# Patient Record
Sex: Female | Born: 2000 | Race: White | Hispanic: No | Marital: Married | Smoking: Never smoker
Health system: Southern US, Community
[De-identification: ages and names within clinical notes are randomized; demographics above are authoritative.]

## PROBLEM LIST (undated history)

## (undated) DIAGNOSIS — T7840XA Allergy, unspecified, initial encounter: Secondary | ICD-10-CM

## (undated) DIAGNOSIS — F32A Depression, unspecified: Secondary | ICD-10-CM

## (undated) DIAGNOSIS — F419 Anxiety disorder, unspecified: Secondary | ICD-10-CM

## (undated) HISTORY — PX: TONSILECTOMY, ADENOIDECTOMY, BILATERAL MYRINGOTOMY AND TUBES: SHX2538

## (undated) HISTORY — PX: WISDOM TOOTH EXTRACTION: SHX21

## (undated) HISTORY — PX: TYMPANOSTOMY TUBE PLACEMENT: SHX32

## (undated) HISTORY — DX: Allergy, unspecified, initial encounter: T78.40XA

## (undated) HISTORY — DX: Anxiety disorder, unspecified: F41.9

## (undated) HISTORY — PX: TONSILLECTOMY: SUR1361

## (undated) HISTORY — DX: Depression, unspecified: F32.A

---
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
## 2021-02-06 ENCOUNTER — Ambulatory Visit: Payer: Medicaid Other | Admitting: Nurse Practitioner

## 2021-02-27 ENCOUNTER — Other Ambulatory Visit: Payer: Self-pay

## 2021-02-27 ENCOUNTER — Ambulatory Visit: Payer: Managed Care, Other (non HMO) | Admitting: Nurse Practitioner

## 2021-02-27 ENCOUNTER — Encounter: Payer: Self-pay | Admitting: Nurse Practitioner

## 2021-02-27 VITALS — BP 110/72 | HR 100 | Temp 97.9°F | Resp 18 | Ht 63.0 in | Wt 156.7 lb

## 2021-02-27 DIAGNOSIS — J0141 Acute recurrent pansinusitis: Secondary | ICD-10-CM | POA: Diagnosis not present

## 2021-02-27 DIAGNOSIS — F3181 Bipolar II disorder: Secondary | ICD-10-CM

## 2021-02-27 DIAGNOSIS — J029 Acute pharyngitis, unspecified: Secondary | ICD-10-CM

## 2021-02-27 DIAGNOSIS — Z7689 Persons encountering health services in other specified circumstances: Secondary | ICD-10-CM

## 2021-02-27 DIAGNOSIS — F99 Mental disorder, not otherwise specified: Secondary | ICD-10-CM

## 2021-02-27 DIAGNOSIS — T7840XA Allergy, unspecified, initial encounter: Secondary | ICD-10-CM

## 2021-02-27 DIAGNOSIS — F5105 Insomnia due to other mental disorder: Secondary | ICD-10-CM

## 2021-02-27 MED ORDER — LEVOFLOXACIN 750 MG PO TABS: 750.0000 mg | ORAL_TABLET | Freq: Every day | ORAL | 0 refills | Status: DC

## 2021-02-27 MED ORDER — LEVOFLOXACIN 750 MG PO TABS: 750.0000 mg | ORAL_TABLET | Freq: Every day | ORAL | 0 refills | Status: AC

## 2021-02-27 NOTE — Progress Notes (Signed)
BP 110/72    Pulse 100    Temp 97.9 F (36.6 C) (Oral)    Resp 18    Ht 5\' 3"  (1.6 m)    Wt 156 lb 11.2 oz (71.1 kg)    SpO2 95%    BMI 27.76 kg/m    Subjective:    Patient ID: , female    DOB: 05-31-2000, 21 y.o.   MRN: [id number]  HPI: Catherine Stewart is a 21 y.o. female, here with husband  Chief Complaint  Patient presents with   Establish Care   URI    Cough, sore throat, weak fatigue, congested   Establish care: She says her last physical 6/22.  She has requested her records from her previous provider.    URI: She says that her symptoms started 02/18/21, with nasal congestion, sore throat and fatigue. She says she went to urgent care and was tested for flu and covid. She says they were negative.  She says her sinus congestion has gotten worse and so has her sore throat.  She denies any fever, cough or shortness of breath.  Her frontal and maxillary sinus is tender to the touch. She says her throat has gotten more sore.  She says it hurts to swallow but is able to.  Will get throat culture.  Discussed OTC treatments for symptoms. Discussed that since her symptoms have lasted for almost two weeks will give antibiotics.   Allergies/ sinusitis:  She says she takes Flonase, zyrtec, Singulair and mucinex.  She says she does not take her allergy medication every day. She says that she has flares through out the year. She says that she would like to have allergy testing done.  Will refer to allergist.   Bipolar type 2/ insomnia:  She says she was on medication when she was [age]. She says she was on abilify and trazodone and was seen at [facility] but had trouble getting back in.  She says she is not currently taking any medication.  She says that she is in a much better place and feels she was diagnosed improperly.  Depression screen [facility] 2/9 02/27/2021  Decreased Interest 0  Down, Depressed, Hopeless 0  PHQ - 2 Score 0  Altered sleeping 0  Tired, decreased energy 0   Change in appetite 0  Feeling bad or failure about yourself  0  Trouble concentrating 0  Moving slowly or fidgety/restless 0  Suicidal thoughts 0  PHQ-9 Score 0  Difficult doing work/chores Not difficult at all    GAD 7 : Generalized Anxiety Score 02/27/2021  Nervous, Anxious, on Edge 0  Control/stop worrying 0  Worry too much - different things 0  Trouble relaxing 0  Restless 0  Easily annoyed or irritable 0  Afraid - awful might happen 0  Total GAD 7 Score 0  Anxiety Difficulty Not difficult at all     Allergies and medications reviewed and updated.  Relevant past medical, surgical, family and social history reviewed and updated as indicated. Interim medical history since our last visit reviewed.  Review of Systems  Constitutional: Negative for fever or weight change.  HEENT: positive for nasal congestion, facial pressure and tenderness and sore throat Respiratory: Negative for cough and shortness of breath.   Cardiovascular: Negative for chest pain or palpitations.  Gastrointestinal: Negative for abdominal pain, no bowel changes.  Musculoskeletal: Negative for gait problem or joint swelling.  Skin: Negative for rash.  Neurological: Negative for dizziness or headache.  No other specific complaints in a complete review of systems (except as listed in HPI above).      Objective:    BP 110/72    Pulse 100    Temp 97.9 F (36.6 C) (Oral)    Resp 18    Ht 5\' 3"  (1.6 m)    Wt 156 lb 11.2 oz (71.1 kg)    SpO2 95%    BMI 27.76 kg/m   Wt Readings from Last 3 Encounters:  02/27/21 156 lb 11.2 oz (71.1 kg) (85 %, Z= 1.06)*  09/23/20 176 lb (79.8 kg) (94 %, Z= 1.54)*  05/09/20 163 lb (73.9 kg) (90 %, Z= 1.28)*   * Growth percentiles are based on CDC (Girls, 2-20 Years) data.    Physical Exam  Constitutional: Patient appears well-developed and well-nourished.  No distress.  HEENT: head atraumatic, normocephalic, pupils equal and reactive to light, ears TMs clear, neck supple,  throat red and inflamed, no  Cardiovascular: Normal rate, regular rhythm and normal heart sounds.  No murmur heard. No BLE edema. Pulmonary/Chest: Effort normal and breath sounds normal. No respiratory distress. Abdominal: Soft.  There is no tenderness. Psychiatric: Patient has a normal mood and affect. behavior is normal. Judgment and thought content normal.   Results for orders placed or performed in visit on 09/23/20  Urine Culture   Specimen: Urine   UR  Result Value Ref Range   Urine Culture, Routine CANCELED   POCT urinalysis dipstick  Result Value Ref Range   Color, UA     Clarity, UA     Glucose, UA Negative Negative   Bilirubin, UA Negative    Ketones, UA Negative    Spec Grav, UA 1.010 1.010 - 1.025   Blood, UA trace    pH, UA 5.0 5.0 - 8.0   Protein, UA Negative Negative   Urobilinogen, UA 1.0 0.2 or 1.0 E.U./dL   Nitrite, UA Negative    Leukocytes, UA Trace (A) Negative   Appearance     Odor    Cervicovaginal ancillary only  Result Value Ref Range   Bacterial Vaginitis (gardnerella) Negative    Candida Vaginitis Positive (A)    Candida Glabrata Negative    Comment      Normal Reference Range Bacterial Vaginosis - Negative   Comment Normal Reference Range Candida Species - Negative    Comment Normal Reference Range Candida Galbrata - Negative       Assessment & Plan:   1. Acute recurrent pansinusitis -OTC treatments for symptoms -push fluids -saline nasal washes - levofloxacin (LEVAQUIN) 750 MG tablet; Take 1 tablet (750 mg total) by mouth daily for 5 days.  Dispense: 5 tablet; Refill: 0  2. Sore throat -gargle with salt water, can use throat lozenges - Culture, Group A Strep  3. Allergy, initial encounter -continue taking allergy medications - Ambulatory referral to Allergy  4. Bipolar 2 disorder (HCC) -continue to monitor  5. Insomnia due to other mental disorder -continue to monitor  6. Encounter to establish care -get records from previous  provider  Follow up plan: Return in about 6 months (around 08/27/2021) for cpe.

## 2021-02-27 NOTE — Addendum Note (Signed)
Addended by: Della Goo F on: 02/27/2021 04:06 PM   Modules accepted: Orders

## 2021-03-01 ENCOUNTER — Other Ambulatory Visit: Payer: Self-pay | Admitting: Nurse Practitioner

## 2021-03-01 ENCOUNTER — Ambulatory Visit: Payer: Self-pay | Admitting: *Deleted

## 2021-03-01 DIAGNOSIS — J020 Streptococcal pharyngitis: Secondary | ICD-10-CM

## 2021-03-01 LAB — CULTURE, GROUP A STREP
MICRO NUMBER:: 12845229
SPECIMEN QUALITY:: ADEQUATE

## 2021-03-01 MED ORDER — CLINDAMYCIN HCL 300 MG PO CAPS: 300.0000 mg | ORAL_CAPSULE | Freq: Three times a day (TID) | ORAL | 0 refills | Status: AC

## 2021-03-01 NOTE — Telephone Encounter (Signed)
°  Summary: strep results/work note   Pt called and stated that she has a note to stay out of work because of possible strep. Pt states results have not come back yet and would like to know if she can return to work. Please advise      Chief Complaint: requesting strep test results, continued sx , right ear pain, drainage. Requesting if she can be written out of work longer due to sx  Symptoms: not feeling good, sore throat, right ear drainage, merky yellow. Pain , lightheaded, short of breath with exertion. Frequency: since 02/27/21 Pertinent Negatives: Patient denies difficulty breathing , chest pain, no fever Disposition: [] ED /[] Urgent Care (no appt availability in office) / [] Appointment(In office/virtual)/ []  [place] [facility]/ [] Home Care/ [] Refused Recommended Disposition /[] [place] [facility]/ [x]  Follow-up with PCP Additional Notes:  Please advise if another antibiotic will help clear up symptoms.  Please advise if another appt need for patient and her husband, seen by E. Rosana Berger. Catherine Stewart DOB: 11/16/1998.strep test result report : active. No results at this time. Please advise.       Reason for Disposition  Yellow or green discharge  Answer Assessment - Initial Assessment Questions 1. TEST: "Was it a rapid strep test or a throat culture for strep?     na 2. RESULT: "Was it positive or negative for strep?"     Results show still active  3. FEVER: "Do you have a fever?" If Yes, ask: "What is your temperature, how was it measured, and when did it start?"     no 4. PAIN: "How bad is the sore throat?"     Continues , no appetite  5. PREGNANCY: "Is there any chance you are pregnant?" "When was your last menstrual period?"     na  Answer Assessment - Initial Assessment Questions 1. LOCATION: "Which ear is involved?"      Right ear  2. COLOR: "What is the color of the discharge?"      Merky , yellow  3. CONSISTENCY: "How runny is the discharge? Could it be water?"       runny 4. ONSET: "When did you first notice the discharge?"     na 5. PAIN: "Is there any earache?" "How bad is it?"  (Scale 1-10; or mild, moderate, severe)     Yes  6. OBJECTS: "Have you put anything in your ear?" (e.g., Q-tip, other object)      na 7. OTHER SYMPTOMS: "Do you have any other symptoms?" (e.g., headache, fever, dizziness, vomiting, runny nose)     Sore throat treated with levaquin  8. PREGNANCY: "Is there any chance you are pregnant?" "When was your last menstrual period?"     na  Protocols used: Strep Throat Test Follow-up Call-[facility], [place]

## 2021-04-03 ENCOUNTER — Ambulatory Visit: Payer: Self-pay

## 2021-04-03 NOTE — Telephone Encounter (Signed)
° °  Chief Complaint: Laceration left thumb Symptoms: Red, draining yellow Frequency: Cut 2 weeks ago Pertinent Negatives: Patient denies fever Disposition: [] ED /[] Urgent Care (no appt availability in office) / [x] Appointment(In office/virtual)/ []  [place] [facility]/ [] Home Care/ [] Refused Recommended Disposition /[] [place] [facility]/ []  Follow-up with PCP Additional Notes:    Reason for Disposition  [1] Looks infected (spreading redness, pus) AND [2] no fever  Answer Assessment - Initial Assessment Questions 1. APPEARANCE of INJURY: "What does the injury look like?"      Yellow and drainig 2. SIZE: "How large is the cut?"      Dime size 3. BLEEDING: "Is it bleeding now?" If Yes, ask: "Is it difficult to stop?"      No 4. LOCATION: "Where is the injury located?"      Left thumb 5. ONSET: "How long ago did the injury occur?"      2 weeks ago 6. MECHANISM: "Tell me how it happened."      Cut 2 weeks ago 7. TETANUS: "When was the last tetanus booster?"     Unsure 8. PREGNANCY: "Is there any chance you are pregnant?" "When was your last menstrual period?"     No  Protocols used: Cuts and Lacerations-[facility]

## 2021-04-03 NOTE — Progress Notes (Signed)
Name: Catherine Stewart   MRN: [id number]    DOB: 2000-03-23   Date:04/04/2021       Progress Note  Subjective  Chief Complaint  Laceration w/Drainage   HPI  Laceration: patient states she was cutting up some vegetables a few weeks ago and cut her left thumb on the palmar side near the base. She went urgent care but sutures or steri strips were not placed she was advised to keep it clean and covered , she came in today because the area is still open and last night it had some yellow drainage. She states it looks better now. Still sore to tough and rom, no redness around the laceration site. She is due for Tdap   Past Surgical History:  Procedure Laterality Date   INDUCED ABORTION  2019   TONSILECTOMY, ADENOIDECTOMY, BILATERAL MYRINGOTOMY AND TUBES     TONSILLECTOMY     TYMPANOSTOMY TUBE PLACEMENT      Family History  Problem Relation Age of Onset   Healthy Mother    Cirrhosis Father     Social History   Tobacco Use   Smoking status: Never   Smokeless tobacco: Never  Substance Use Topics   Alcohol use: Yes     Current Outpatient Medications:    cetirizine (ZYRTEC) 10 MG tablet, Take 10 mg by mouth at bedtime., Disp: , Rfl:    fluticasone (FLONASE) 50 MCG/ACT nasal spray, Place 1 spray into both nostrils daily., Disp: , Rfl:    Levonorgestrel (KYLEENA) 19.5 MG IUD, by Intrauterine route., Disp: , Rfl:    montelukast (SINGULAIR) 10 MG tablet, Take 10 mg by mouth at bedtime., Disp: , Rfl:   Allergies  Allergen Reactions   Doxycycline Nausea And Vomiting   Penicillins Hives   Amoxicillin-Pot Clavulanate Rash    I personally reviewed active problem list, medication list, allergies, family history, social history, health maintenance with the patient/caregiver today.   ROS  Ten systems reviewed and is negative except as mentioned in HPI   Objective  Vitals:   04/04/21 0751  BP: 126/70  Pulse: 91  Resp: 16  SpO2: 99%  Weight: 153 lb (69.4 kg)  Height: 5\' 3"  (1.6 m)     Body mass index is 27.1 kg/m.  Physical Exam  Constitutional: Patient appears well-developed and well-nourished.  No distress.  HEENT: head atraumatic, normocephalic, pupils equal and reactive to light, neck supple Cardiovascular: Normal rate, regular rhythm and normal heart sounds.  No murmur heard. No BLE edema. Pulmonary/Chest: Effort normal and breath sounds normal. No respiratory distress. Abdominal: Soft.  There is no tenderness. Thumb: see attached picture Psychiatric: Patient has a normal mood and affect. behavior is normal. Judgment and thought content normal.   Recent Results (from the past 2160 hour(s))  Culture, Group A Strep     Status: Abnormal   Collection Time: 02/27/21  3:42 PM   Specimen: Throat  Result Value Ref Range   MICRO NUMBER: 04/27/21    SPECIMEN QUALITY: Adequate    SOURCE: THROAT    STATUS: FINAL    ISOLATE 1: Streptococcus pyogenes (A)     Comment: Group A Streptococcus isolated Beta-hemolytic streptococci are predictably susceptible to Penicillin and other beta-lactams. Susceptibility testing not routinely performed. Please contact the laboratory within 3 days if susceptibility testing is desired.     PHQ2/9: Depression screen [facility] 2/9 04/04/2021 02/27/2021  Decreased Interest 0 0  Down, Depressed, Hopeless 0 0  PHQ - 2 Score 0 0  Altered sleeping  0 0  Tired, decreased energy 0 0  Change in appetite 0 0  Feeling bad or failure about yourself  0 0  Trouble concentrating 0 0  Moving slowly or fidgety/restless 0 0  Suicidal thoughts 0 0  PHQ-9 Score 0 0  Difficult doing work/chores - Not difficult at all    phq 9 is negative   Fall Risk: Fall Risk  04/04/2021 02/27/2021  Falls in the past year? 0 0  Number falls in past yr: 0 0  Injury with Fall? 0 0  Risk for fall due to : No Fall Risks -  Follow up Falls prevention discussed Falls evaluation completed      Functional Status Survey: Is the patient deaf or have difficulty hearing?:  No Does the patient have difficulty seeing, even when wearing glasses/contacts?: Yes Does the patient have difficulty concentrating, remembering, or making decisions?: No Does the patient have difficulty walking or climbing stairs?: No Does the patient have difficulty dressing or bathing?: No Does the patient have difficulty doing errands alone such as visiting a doctor's office or shopping?: No    Assessment & Plan  1. Laceration of left thumb without damage to nail, foreign body presence unspecified, subsequent encounter  - Tdap vaccine greater than or equal to 7yo IM Advised to do rom exercise, try to keep area open when resting in pm  2. Need for Tdap vaccination  - Tdap vaccine greater than or equal to 7yo IM

## 2021-04-04 ENCOUNTER — Encounter: Payer: Self-pay | Admitting: Family Medicine

## 2021-04-04 ENCOUNTER — Other Ambulatory Visit: Payer: Self-pay

## 2021-04-04 ENCOUNTER — Ambulatory Visit (INDEPENDENT_AMBULATORY_CARE_PROVIDER_SITE_OTHER): Payer: Managed Care, Other (non HMO) | Admitting: Family Medicine

## 2021-04-04 VITALS — BP 126/70 | HR 91 | Resp 16 | Ht 63.0 in | Wt 153.0 lb

## 2021-04-04 DIAGNOSIS — Z23 Encounter for immunization: Secondary | ICD-10-CM

## 2021-04-04 DIAGNOSIS — S61012D Laceration without foreign body of left thumb without damage to nail, subsequent encounter: Secondary | ICD-10-CM

## 2021-04-04 DIAGNOSIS — J302 Other seasonal allergic rhinitis: Secondary | ICD-10-CM | POA: Insufficient documentation

## 2021-04-19 ENCOUNTER — Other Ambulatory Visit: Payer: Self-pay

## 2021-04-19 ENCOUNTER — Encounter: Payer: Self-pay | Admitting: Obstetrics and Gynecology

## 2021-04-19 ENCOUNTER — Ambulatory Visit (INDEPENDENT_AMBULATORY_CARE_PROVIDER_SITE_OTHER): Payer: Managed Care, Other (non HMO) | Admitting: Obstetrics and Gynecology

## 2021-04-19 VITALS — BP 118/60 | Ht 63.0 in | Wt 149.0 lb

## 2021-04-19 DIAGNOSIS — Z3169 Encounter for other general counseling and advice on procreation: Secondary | ICD-10-CM

## 2021-04-19 DIAGNOSIS — Z30432 Encounter for removal of intrauterine contraceptive device: Secondary | ICD-10-CM

## 2021-04-19 NOTE — Progress Notes (Signed)
? ?  Chief Complaint  ?Patient presents with  ? IUD Removal   ? ? ? ?History of Present Illness:  Catherine Stewart is a 21 y.o. that had a  [place]  IUD placed approximately 2 years ago. Since that time, she denies dyspareunia, pelvic pain, non-menstrual bleeding, vaginal d/c, heavy bleeding. Has occas spotting. Would like to conceive. Not taking PNVs. Menses were irregular prior to IUD age 22.  ?Neg STD testing 3/22; married now, no longer indicated. ? ?BP 118/60   Ht 5\' 3"  (1.6 m)   Wt 149 lb (67.6 kg)   BMI 26.39 kg/m?  ? ?Pelvic exam:  ?Two IUD strings present seen coming from the cervical os. ?EGBUS, vaginal vault and cervix: within normal limits ? ?IUD Removal ?Strings of IUD identified and grasped.  IUD removed without problem with ring forceps.  Pt tolerated this well.  IUD noted to be intact. ? ?Assessment:  ?Encounter for IUD removal ? ?Pre-conception counseling--start PNVs. ? ?F/u prn NOB  ? ?Deziah Renwick B. Nico Syme, PA-C ?04/19/2021 ?3:35 PM ?  ?

## 2021-04-27 ENCOUNTER — Ambulatory Visit: Payer: Medicaid Other | Admitting: Obstetrics & Gynecology

## 2021-08-28 ENCOUNTER — Encounter: Payer: Managed Care, Other (non HMO) | Admitting: Nurse Practitioner

## 2021-08-28 NOTE — Progress Notes (Deleted)
Name: Catherine Stewart   MRN: [id number]    DOB: [birth year]   Date:08/28/2021       Progress Note  Subjective  Chief Complaint  No chief complaint on file.   HPI  Patient presents for annual CPE.  Diet: *** Exercise: ***   Flowsheet Row Office Visit from 02/27/2021 in [facility]  AUDIT-C Score 0      Depression: Phq 9 is  {Desc; negative/positive:13464}    04/04/2021    7:51 AM 02/27/2021    3:16 PM  Depression screen PHQ 2/9  Decreased Interest 0 0  Down, Depressed, Hopeless 0 0  PHQ - 2 Score 0 0  Altered sleeping 0 0  Tired, decreased energy 0 0  Change in appetite 0 0  Feeling bad or failure about yourself  0 0  Trouble concentrating 0 0  Moving slowly or fidgety/restless 0 0  Suicidal thoughts 0 0  PHQ-9 Score 0 0  Difficult doing work/chores  Not difficult at all   Hypertension: BP Readings from Last 3 Encounters:  04/19/21 118/60  04/04/21 126/70  02/27/21 110/72   Obesity: Wt Readings from Last 3 Encounters:  04/19/21 149 lb (67.6 kg) (79 %, Z= 0.82)*  04/04/21 153 lb (69.4 kg) (83 %, Z= 0.94)*  02/27/21 156 lb 11.2 oz (71.1 kg) (85 %, Z= 1.06)*   * Growth percentiles are based on CDC (Girls, 2-20 Years) data.   BMI Readings from Last 3 Encounters:  04/19/21 26.39 kg/m (85 %, Z= 1.04)*  04/04/21 27.10 kg/m (87 %, Z= 1.15)*  02/27/21 27.76 kg/m (89 %, Z= 1.24)*   * Growth percentiles are based on CDC (Girls, 2-20 Years) data.     Vaccines:  HPV: up to at age 36 , ask insurance if age between 72-45  Shingrix: 52-64 yo and ask insurance if covered when patient above 68 yo Pneumonia:  educated and discussed with patient. Flu:  educated and discussed with patient.  Hep C Screening: ordered STD testing and prevention (HIV/chl/gon/syphilis): ordered Intimate partner violence:*** Sexual History : Menstrual History/LMP/Abnormal Bleeding:  Incontinence Symptoms:   Breast cancer:  - Last Mammogram: no concerns , does not  qualify - BRCA gene screening: none  Osteoporosis: Discussed high calcium and vitamin D supplementation, weight bearing exercises  Cervical cancer screening: no concerns, does not qualify  Skin cancer: Discussed monitoring for atypical lesions  Colorectal cancer: no concerns, does not qualify Lung cancer:   Low Dose CT Chest recommended if Age 60-80 years, 20 pack-year currently smoking OR have quit w/in 15years. Patient does not qualify.   ECG: none  Advanced Care Planning: A voluntary discussion about advance care planning including the explanation and discussion of advance directives.  Discussed health care proxy and Living will, and the patient was able to identify a health care proxy as ***.  Patient {DOES_DOES HYQ:65784} have a living will at present time. If patient does have living will, I have requested they bring this to the clinic to be scanned in to their chart.  Lipids: No results found for: "CHOL" No results found for: "HDL" No results found for: "LDLCALC" No results found for: "TRIG" No results found for: "CHOLHDL" No results found for: "LDLDIRECT"  Glucose: Glucose, Bld  Date Value Ref Range Status  10/09/2019 112 (H) 70 - 99 mg/dL Final    Comment:    Glucose reference range applies only to samples taken after fasting for at least 8 hours.  12/30/2018 95 70 - 99  mg/dL Final    Patient Active Problem List   Diagnosis Date Noted   Seasonal allergies 04/04/2021    Past Surgical History:  Procedure Laterality Date   INDUCED ABORTION  2019   TONSILECTOMY, ADENOIDECTOMY, BILATERAL MYRINGOTOMY AND TUBES     TONSILLECTOMY     TYMPANOSTOMY TUBE PLACEMENT      Family History  Problem Relation Age of Onset   Healthy Mother    Cirrhosis Father     Social History   Socioeconomic History   Marital status: Married    Spouse name: [name]   Number of children: Not on file   Years of education: Not on file   Highest education level: Not on file  Occupational  History   Not on file  Tobacco Use   Smoking status: Never   Smokeless tobacco: Never  Vaping Use   Vaping Use: Some days  Substance and Sexual Activity   Alcohol use: Yes   Drug use: Never   Sexual activity: Yes    Birth control/protection: I.U.D.  Other Topics Concern   Not on file  Social History Narrative   Not on file   Social Determinants of Health   Financial Resource Strain: Not on file  Food Insecurity: Not on file  Transportation Needs: Not on file  Physical Activity: Not on file  Stress: Not on file  Social Connections: Not on file  Intimate Partner Violence: Not on file     Current Outpatient Medications:    cetirizine (ZYRTEC) 10 MG tablet, Take 10 mg by mouth at bedtime., Disp: , Rfl:    fluticasone (FLONASE) 50 MCG/ACT nasal spray, Place 1 spray into both nostrils daily., Disp: , Rfl:    Levonorgestrel (KYLEENA) 19.5 MG IUD, by Intrauterine route. (Patient not taking: Reported on 04/19/2021), Disp: , Rfl:    montelukast (SINGULAIR) 10 MG tablet, Take 10 mg by mouth at bedtime., Disp: , Rfl:   Allergies  Allergen Reactions   Doxycycline Nausea And Vomiting   Penicillins Hives   Amoxicillin-Pot Clavulanate Rash     ROS  Constitutional: Negative for fever or weight change.  Respiratory: Negative for cough and shortness of breath.   Cardiovascular: Negative for chest pain or palpitations.  Gastrointestinal: Negative for abdominal pain, no bowel changes.  Musculoskeletal: Negative for gait problem or joint swelling.  Skin: Negative for rash.  Neurological: Negative for dizziness or headache.  No other specific complaints in a complete review of systems (except as listed in HPI above).   Objective  There were no vitals filed for this visit.  There is no height or weight on file to calculate BMI.  Physical Exam Constitutional: Patient appears well-developed and well-nourished. No distress.  HENT: Head: Normocephalic and atraumatic. Ears: B TMs ok,  no erythema or effusion; Nose: Nose normal. Mouth/Throat: Oropharynx is clear and moist. No oropharyngeal exudate.  Eyes: Conjunctivae and EOM are normal. Pupils are equal, round, and reactive to light. No scleral icterus.  Neck: Normal range of motion. Neck supple. No JVD present. No thyromegaly present.  Cardiovascular: Normal rate, regular rhythm and normal heart sounds.  No murmur heard. No BLE edema. Pulmonary/Chest: Effort normal and breath sounds normal. No respiratory distress. Abdominal: Soft. Bowel sounds are normal, no distension. There is no tenderness. no masses Breast: no lumps or masses, no nipple discharge or rashes Musculoskeletal: Normal range of motion, no joint effusions. No gross deformities Neurological: he is alert and oriented to person, place, and time. No cranial nerve deficit. Coordination,  balance, strength, speech and gait are normal.  Skin: Skin is warm and dry. No rash noted. No erythema.  Psychiatric: Patient has a normal mood and affect. behavior is normal. Judgment and thought content normal.     Fall Risk:    04/04/2021    7:51 AM 02/27/2021    3:15 PM  Fall Risk   Falls in the past year? 0 0  Number falls in past yr: 0 0  Injury with Fall? 0 0  Risk for fall due to : No Fall Risks   Follow up Falls prevention discussed Falls evaluation completed   ***  Functional Status Survey:   ***  Assessment & Plan  There are no diagnoses linked to this encounter.  -USPSTF grade A and B recommendations reviewed with patient; age-appropriate recommendations, preventive care, screening tests, etc discussed and encouraged; healthy living encouraged; see AVS for patient education given to patient -Discussed importance of 150 minutes of physical activity weekly, eat two servings of fish weekly, eat one serving of tree nuts ( cashews, pistachios, pecans, almonds.Marland Kitchen) every other day, eat 6 servings of fruit/vegetables daily and drink plenty of water and avoid sweet  beverages.

## 2021-09-01 ENCOUNTER — Ambulatory Visit (INDEPENDENT_AMBULATORY_CARE_PROVIDER_SITE_OTHER): Payer: Managed Care, Other (non HMO)

## 2021-09-01 VITALS — BP 110/60 | Wt 139.8 lb

## 2021-09-01 DIAGNOSIS — N912 Amenorrhea, unspecified: Secondary | ICD-10-CM | POA: Diagnosis not present

## 2021-09-01 LAB — POCT URINE PREGNANCY: Preg Test, Ur: POSITIVE — AB

## 2021-09-01 NOTE — Progress Notes (Signed)
Subjective:    Catherine Stewart is a 21 y.o. female who presents for evaluation of amenorrhea. She believes she could be pregnant. Pregnancy is desired.  Last period was normal.   No LMP recorded. (Menstrual status: IUD). Patient reports that IUD was removed March 2023 The following portions of the patient's history were reviewed and updated as appropriate: allergies, current medications, past family history, past medical history, past social history, past surgical history, and problem list.   Lab Review Urine HCG: positive    Assessment:    Absence of menstruation.     Plan:    Pregnancy Test:  Positive: EDD: 03/26/22. Briefly discussed positive results and sent to check out for scheduling for New OB appointments.

## 2021-09-01 NOTE — Patient Instructions (Signed)

## 2021-09-04 ENCOUNTER — Ambulatory Visit (INDEPENDENT_AMBULATORY_CARE_PROVIDER_SITE_OTHER): Payer: Managed Care, Other (non HMO)

## 2021-09-04 VITALS — Wt 138.0 lb

## 2021-09-04 DIAGNOSIS — Z3481 Encounter for supervision of other normal pregnancy, first trimester: Secondary | ICD-10-CM

## 2021-09-04 DIAGNOSIS — Z3689 Encounter for other specified antenatal screening: Secondary | ICD-10-CM

## 2021-09-04 NOTE — Progress Notes (Signed)
New OB Intake  I connected with  Catherine Stewart on 09/04/21 at  3:15 PM EDT by telephone and verified that I am speaking with the correct person using two identifiers. 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  I explained I am completing New OB Intake today. We discussed her EDD of 03/26/2022 that is based on LMP of approx 06/19/2021. Pt is G2/P0010. I reviewed her allergies, medications, Medical/Surgical/OB history, and appropriate screenings. Based on history, this is a/an pregnancy uncomplicated .   Patient Active Problem List   Diagnosis Date Noted   Seasonal allergies 04/04/2021    Concerns addressed today Pt c/o nausea; adv give per new protocol.  Pt states she has ondansetron $RemoveBeforeD'4mg'jBzpMcgmqdHwBd$  - isi it okay to take; adv it was.  Delivery Plans:  Plans to deliver at [facility].  Adv if delivers at [facility] she will have someone delivering her that she hasn't met before.  Anatomy US Explained first [place] will be scheduled for dating and will have an anatomy scan at 20wks.  Labs Discussed genetic screening with patient. Patient desires genetic testing to be drawn with new OB labs. Discussed possible labs to be drawn at new OB appointment.  COVID Vaccine Patient has not had COVID vaccine.   Social Determinants of Health Food Insecurity: denies food insecurity Transportation: Patient denies transportation needs.  First visit review I reviewed new OB appt with pt. I explained she will have ob bloodwork and pap smear/pelvic exam if indicated. Explained pt will be seen by Catherine Stewart, CNM at first visit; encounter routed to appropriate provider.   Catherine Stewart, [facility] 09/04/2021  3:46 PM  Clinical Staff Provider  Office Location  [facility] Dating    Language  English Anatomy US    Flu Vaccine  offer Genetic Screen  NIPS:   TDaP vaccine   offer Hgb A1C or  GTT Early : Third trimester :   Covid declines   LAB RESULTS   Rhogam   Blood Type     Feeding Plan breast Antibody     Contraception IUD- ?Catherine Stewart Rubella    Circumcision yes RPR     Pediatrician  undecided HBsAg     Support Person Catherine Stewart HIV    Prenatal Classes no Varicella     GBS  (For PCN allergy, check sensitivities)   BTL Consent  Hep C   VBAC Consent  Pap      Hgb Electro      CF      SMA

## 2021-09-08 ENCOUNTER — Other Ambulatory Visit: Payer: Managed Care, Other (non HMO)

## 2021-09-08 DIAGNOSIS — Z3481 Encounter for supervision of other normal pregnancy, first trimester: Secondary | ICD-10-CM

## 2021-09-12 ENCOUNTER — Encounter: Payer: Self-pay | Admitting: Licensed Practical Nurse

## 2021-09-12 LAB — CBC/D/PLT+RPR+RH+ABO+RUBIGG...
Antibody Screen: NEGATIVE
Basophils Absolute: 0.1 10*3/uL (ref 0.0–0.2)
Basos: 1 %
EOS (ABSOLUTE): 0 10*3/uL (ref 0.0–0.4)
Eos: 0 %
HCV Ab: NONREACTIVE
HIV Screen 4th Generation wRfx: NONREACTIVE
Hematocrit: 40 % (ref 34.0–46.6)
Hemoglobin: 13.5 g/dL (ref 11.1–15.9)
Hepatitis B Surface Ag: NEGATIVE
Immature Grans (Abs): 0 10*3/uL (ref 0.0–0.1)
Immature Granulocytes: 0 %
Lymphocytes Absolute: 2.5 10*3/uL (ref 0.7–3.1)
Lymphs: 26 %
MCH: 31.4 pg (ref 26.6–33.0)
MCHC: 33.8 g/dL (ref 31.5–35.7)
MCV: 93 fL (ref 79–97)
Monocytes Absolute: 0.5 10*3/uL (ref 0.1–0.9)
Monocytes: 5 %
Neutrophils Absolute: 6.5 10*3/uL (ref 1.4–7.0)
Neutrophils: 68 %
Platelets: 309 10*3/uL (ref 150–450)
RBC: 4.3 x10E6/uL (ref 3.77–5.28)
RDW: 11.7 % (ref 11.7–15.4)
RPR Ser Ql: NONREACTIVE
Rh Factor: POSITIVE
Rubella Antibodies, IGG: 8 index (ref >0.99)
Varicella zoster IgG: 167 index (ref >165)
WBC: 9.6 10*3/uL (ref 3.4–10.8)

## 2021-09-12 LAB — HCV INTERPRETATION

## 2021-09-12 NOTE — Addendum Note (Signed)
Addended by: Loran Senters D on: 09/12/2021 03:56 PM   Modules accepted: Orders

## 2021-09-14 ENCOUNTER — Ambulatory Visit: Payer: Managed Care, Other (non HMO)

## 2021-09-14 DIAGNOSIS — Z3481 Encounter for supervision of other normal pregnancy, first trimester: Secondary | ICD-10-CM

## 2021-09-14 LAB — MATERNIT 21 PLUS CORE, BLOOD
Fetal Fraction: 6
Result (T21): NEGATIVE
Trisomy 13 (Patau syndrome): NEGATIVE
Trisomy 18 (Edwards syndrome): NEGATIVE
Trisomy 21 (Down syndrome): NEGATIVE

## 2021-09-15 ENCOUNTER — Ambulatory Visit: Payer: Medicaid Other

## 2021-09-15 ENCOUNTER — Encounter: Payer: Managed Care, Other (non HMO) | Admitting: Licensed Practical Nurse

## 2021-09-21 ENCOUNTER — Ambulatory Visit: Payer: Managed Care, Other (non HMO)

## 2021-09-26 ENCOUNTER — Encounter: Payer: Managed Care, Other (non HMO) | Admitting: Obstetrics & Gynecology

## 2021-09-28 ENCOUNTER — Encounter: Payer: Self-pay | Admitting: Obstetrics & Gynecology

## 2021-09-28 ENCOUNTER — Ambulatory Visit: Payer: Managed Care, Other (non HMO) | Admitting: Obstetrics & Gynecology

## 2021-09-28 ENCOUNTER — Ambulatory Visit: Payer: Managed Care, Other (non HMO)

## 2021-09-28 VITALS — BP 120/70 | Wt 136.0 lb

## 2021-09-28 DIAGNOSIS — Z3687 Encounter for antenatal screening for uncertain dates: Secondary | ICD-10-CM | POA: Diagnosis not present

## 2021-09-28 DIAGNOSIS — Z3401 Encounter for supervision of normal first pregnancy, first trimester: Secondary | ICD-10-CM

## 2021-09-28 DIAGNOSIS — Z113 Encounter for screening for infections with a predominantly sexual mode of transmission: Secondary | ICD-10-CM

## 2021-10-01 LAB — NUSWAB VAGINITIS PLUS (VG+)
Candida albicans, NAA: NEGATIVE
Candida glabrata, NAA: NEGATIVE
Chlamydia trachomatis, NAA: NEGATIVE
Neisseria gonorrhoeae, NAA: NEGATIVE
Trich vag by NAA: NEGATIVE

## 2021-10-04 ENCOUNTER — Encounter: Payer: Self-pay | Admitting: Obstetrics and Gynecology

## 2021-10-14 ENCOUNTER — Encounter: Payer: Self-pay | Admitting: Obstetrics & Gynecology

## 2021-10-14 NOTE — Progress Notes (Signed)
  Subjective:    Catherine Stewart is being seen today for her first obstetrical visit. She is at [redacted]w[redacted]d gestation.She denies LOF and vaginal bleeding   Patient reports no complaints.  Review of Systems:   Review of SystemsL No significant findings  Objective:     BP 120/70   Wt 136 lb (61.7 kg)   LMP 06/19/2021 (Approximate) Comment: patient states unsure of last LMP states her app keeps track and states her last LMP was May 2023 but patient believes she had a cycle in June 2023  BMI 24.09 kg/m  Physical Exam  Exam VSS ABD soft non-tender  UT 10-12 wks size  Ext. no edema     Assessment:    Pregnancy: G2P0010 Patient Active Problem List   Diagnosis Date Noted   Seasonal allergies 04/04/2021       Plan:      Problem list reviewed and updated. Role of ultrasound in pregnancy discussed; fetal survey: :  Follow up in 4 weeks NU swab done.    Linzie Collin 10/14/2021

## 2021-10-26 ENCOUNTER — Ambulatory Visit: Payer: Managed Care, Other (non HMO) | Admitting: Obstetrics & Gynecology

## 2021-10-26 VITALS — BP 118/70 | Wt 143.0 lb

## 2021-10-26 DIAGNOSIS — Z3A14 14 weeks gestation of pregnancy: Secondary | ICD-10-CM

## 2021-10-26 DIAGNOSIS — Z3482 Encounter for supervision of other normal pregnancy, second trimester: Secondary | ICD-10-CM

## 2021-10-26 LAB — POCT URINALYSIS DIPSTICK OB
Bilirubin, UA: NEGATIVE
Blood, UA: NEGATIVE
Glucose, UA: NEGATIVE
Ketones, UA: NEGATIVE
Nitrite, UA: NEGATIVE
Spec Grav, UA: 1.025 (ref 1.010–1.025)
Urobilinogen, UA: 0.2 E.U./dL
pH, UA: 5 (ref 5.0–8.0)

## 2021-10-26 NOTE — Progress Notes (Signed)
No vb. No lof.  

## 2021-11-14 ENCOUNTER — Encounter: Payer: Self-pay | Admitting: Obstetrics and Gynecology

## 2021-11-22 ENCOUNTER — Ambulatory Visit (INDEPENDENT_AMBULATORY_CARE_PROVIDER_SITE_OTHER): Payer: Medicaid Other | Admitting: Certified Nurse Midwife

## 2021-11-22 VITALS — BP 118/74 | HR 85 | Wt 155.0 lb

## 2021-11-22 DIAGNOSIS — Z3A18 18 weeks gestation of pregnancy: Secondary | ICD-10-CM

## 2021-11-22 DIAGNOSIS — Z3482 Encounter for supervision of other normal pregnancy, second trimester: Secondary | ICD-10-CM

## 2021-11-22 NOTE — Patient Instructions (Signed)
Round Ligament Pain  The round ligaments are a pair of cord-like tissues that help support the uterus. They can become a source of pain during pregnancy as the ligaments soften and stretch as the baby grows. The pain usually begins in the second trimester (13-28 weeks) of pregnancy, and should only last for a few seconds when it occurs. However, the pain can come and go until the baby is delivered. The pain does not cause harm to the baby. Round ligament pain is usually a short, sharp, and pinching pain, but it can also be a dull, lingering, and aching pain. The pain is felt in the lower side of the abdomen or in the groin. It usually starts deep in the groin and moves up to the outside of the hip area. The pain may happen when you: Suddenly change position, such as quickly going from a sitting to standing position. Do physical activity. Cough or sneeze. Follow these instructions at home: Managing pain  When the pain starts, relax. Then, try any of these methods to help with the pain: Sit down. Flex your knees up to your abdomen. Lie on your side with one pillow under your abdomen and another pillow between your legs. Sit in a warm bath for 15-20 minutes or until the pain goes away. General instructions Watch your condition for any changes. Move slowly when you sit down or stand up. Stop or reduce your physical activities if they cause pain. Avoid long walks if they cause pain. Take over-the-counter and prescription medicines only as told by your health care provider. Keep all follow-up visits. This is important. Contact a health care provider if: Your pain does not go away with treatment. You feel pain in your back that you did not have before. Your medicine is not helping. You have a fever or chills. You have nausea or vomiting. You have diarrhea. You have pain when you urinate. Get help right away if: You have pain that is a rhythmic, cramping pain similar to labor pains. Labor  pains are usually 2 minutes apart, last for about 1 minute, and involve a bearing down feeling or pressure in your pelvis. You have vaginal bleeding. These symptoms may represent a serious problem that is an emergency. Do not wait to see if the symptoms will go away. Get medical help right away. Call your local emergency services (911 in the [place] Do not drive yourself to the hospital. Summary Round ligament pain is felt in the lower abdomen or groin. This pain usually begins in the second trimester (13-28 weeks) and should only last for a few seconds when it occurs. You may notice the pain when you suddenly change position, when you cough or sneeze, or during physical activity. Relaxing, flexing your knees to your abdomen, lying on one side, or taking a warm bath may help to get rid of the pain. Contact your health care provider if the pain does not go away. This information is not intended to replace advice given to you by your health care provider. Make sure you discuss any questions you have with your health care provider. Document Revised: 04/20/2020 Document Reviewed: 04/20/2020 Elsevier Patient Education  2023 Elsevier Inc.  

## 2021-11-22 NOTE — Progress Notes (Signed)
ROB doing well, has some fatigue but other wise doing well. Discussed u/s in 2 wk for anatomy. She verbalizes and agrees. Follow up 2 wks.   Philip Aspen, CNM

## 2021-11-23 ENCOUNTER — Encounter: Payer: Managed Care, Other (non HMO) | Admitting: Obstetrics & Gynecology

## 2021-12-07 ENCOUNTER — Encounter: Payer: Self-pay | Admitting: Obstetrics

## 2021-12-07 ENCOUNTER — Ambulatory Visit (INDEPENDENT_AMBULATORY_CARE_PROVIDER_SITE_OTHER): Payer: Managed Care, Other (non HMO)

## 2021-12-07 ENCOUNTER — Ambulatory Visit (INDEPENDENT_AMBULATORY_CARE_PROVIDER_SITE_OTHER): Payer: Medicaid Other | Admitting: Obstetrics

## 2021-12-07 VITALS — BP 121/73 | HR 73 | Wt 160.0 lb

## 2021-12-07 DIAGNOSIS — Z3A20 20 weeks gestation of pregnancy: Secondary | ICD-10-CM

## 2021-12-07 DIAGNOSIS — Z3402 Encounter for supervision of normal first pregnancy, second trimester: Secondary | ICD-10-CM

## 2021-12-07 DIAGNOSIS — Z3689 Encounter for other specified antenatal screening: Secondary | ICD-10-CM | POA: Diagnosis not present

## 2021-12-07 DIAGNOSIS — Z3A18 18 weeks gestation of pregnancy: Secondary | ICD-10-CM

## 2021-12-07 DIAGNOSIS — Z3A19 19 weeks gestation of pregnancy: Secondary | ICD-10-CM | POA: Diagnosis not present

## 2021-12-07 LAB — POCT URINALYSIS DIPSTICK OB
Bilirubin, UA: NEGATIVE
Blood, UA: NEGATIVE
Glucose, UA: NEGATIVE
Ketones, UA: NEGATIVE
Leukocytes, UA: NEGATIVE
Nitrite, UA: NEGATIVE
POC,PROTEIN,UA: NEGATIVE
Spec Grav, UA: 1.015 (ref 1.010–1.025)
Urobilinogen, UA: 0.2 E.U./dL
pH, UA: 6.5 (ref 5.0–8.0)

## 2021-12-07 NOTE — Progress Notes (Signed)
ROB at [redacted]w[redacted]d. Had anatomy scan today; reviewed results with Erasmo Downer and [name]. WNL, baby 12 oz, breech. Discussed normal discomforts of 2nd trimester. Questions answered. Encouraged CBE. Declines flu shot today. RTC in 4 weeks.  Lloyd Huger, CNM

## 2022-01-01 ENCOUNTER — Other Ambulatory Visit: Payer: Self-pay

## 2022-01-01 ENCOUNTER — Emergency Department
Admission: EM | Admit: 2022-01-01 | Discharge: 2022-01-01 | Disposition: A | Discharge status: Home / Self Care | Payer: Managed Care, Other (non HMO) | Attending: Student in an Organized Health Care Education/Training Program | Admitting: Student in an Organized Health Care Education/Training Program

## 2022-01-01 ENCOUNTER — Telehealth: Payer: Self-pay

## 2022-01-01 ENCOUNTER — Encounter: Payer: Self-pay | Admitting: Emergency Medicine

## 2022-01-01 DIAGNOSIS — U071 COVID-19: Secondary | ICD-10-CM | POA: Insufficient documentation

## 2022-01-01 DIAGNOSIS — Z3A24 24 weeks gestation of pregnancy: Secondary | ICD-10-CM | POA: Diagnosis not present

## 2022-01-01 DIAGNOSIS — O98512 Other viral diseases complicating pregnancy, second trimester: Secondary | ICD-10-CM | POA: Diagnosis not present

## 2022-01-01 DIAGNOSIS — O26892 Other specified pregnancy related conditions, second trimester: Secondary | ICD-10-CM | POA: Diagnosis present

## 2022-01-01 LAB — RESP PANEL BY RT-PCR (FLU A&B, COVID) ARPGX2
Influenza A by PCR: NEGATIVE
Influenza B by PCR: NEGATIVE
SARS Coronavirus 2 by RT PCR: POSITIVE — AB

## 2022-01-01 NOTE — ED Triage Notes (Signed)
Pt to ED via POV for fever, body aches, and congestion. Pt states that she has been taking OTC medication for the fever and it will go down but then come back when the medication wears off. Pt is in NAD.

## 2022-01-01 NOTE — Telephone Encounter (Signed)
No other special precautions at this time, however if she begins to have fevers that can't be controlled with Tylenol or begins to experience difficulty breathing (SOB) she needs to report to the ER again for admission.

## 2022-01-01 NOTE — Telephone Encounter (Signed)
Patient went to ED for high fevers and body aches. She tested positive for COVID. She was calling to see if there was any special precautions she should take for baby. I advised her to treat her symptoms according the safe mediation list for pregnancy and to stay hydrated. I did not know of any precautions she should take for baby. Please advise.

## 2022-01-01 NOTE — ED Provider Notes (Signed)
[facility] Provider Note    Event Date/Time   First MD Initiated Contact with Patient 01/01/22 1102     (approximate)   History   Fever and Generalized Body Aches   HPI  Catherine Stewart is a 21 y.o. female [redacted] weeks pregnant presents to the ER for evaluation of several days of congestion fevers chills muscle aches.  Has been having some mild headaches as well.  No vomiting staying well-hydrated.  Feeling the baby kick and move.  No leakage of fluids or vaginal bleeding.     Physical Exam   Triage Vital Signs: ED Triage Vitals  Enc Vitals Group     BP 01/01/22 0958 115/70     Pulse Rate 01/01/22 0958 100     Resp 01/01/22 0958 16     Temp 01/01/22 0958 98.8 F (37.1 C)     Temp Source 01/01/22 0958 Oral     SpO2 01/01/22 0958 98 %     Weight 01/01/22 1108 160 lb 0.9 oz (72.6 kg)     Height 01/01/22 1108 5\' 3"  (1.6 m)     Head Circumference --      Peak Flow --      Pain Score 01/01/22 0956 5     Pain Loc --      Pain Edu? --      Excl. in GC? --     Most recent vital signs: Vitals:   01/01/22 0958  BP: 115/70  Pulse: 100  Resp: 16  Temp: 98.8 F (37.1 C)  SpO2: 98%     Constitutional: Alert  Eyes: Conjunctivae are normal.  Head: Atraumatic. Nose: No congestion/rhinnorhea. Mouth/Throat: Mucous membranes are moist.   Neck: Painless ROM.  Cardiovascular:   Good peripheral circulation. Respiratory: Normal respiratory effort.  No retractions.  Gastrointestinal: Soft and nontender.  Musculoskeletal:  no deformity Neurologic:  MAE spontaneously. No gross focal neurologic deficits are appreciated.  Skin:  Skin is warm, dry and intact. No rash noted. Psychiatric: Mood and affect are normal. Speech and behavior are normal.    ED Results / Procedures / Treatments   Labs (all labs ordered are listed, but only abnormal results are displayed) Labs Reviewed  RESP PANEL BY RT-PCR (FLU A&B, COVID) ARPGX2 - Abnormal; Notable for the  following components:      Result Value   SARS Coronavirus 2 by RT PCR POSITIVE (*)    All other components within normal limits     EKG     RADIOLOGY EMERGENCY DEPARTMENT 01/03/22 PREGNANCY "Study: Limited Ultrasound of the Pelvis for Pregnancy"  INDICATIONS:Pregnancy(required) Multiple views of the uterus and pelvic cavity were obtained in real-time with a multi-frequency probe.  APPROACH:Transabdominal  PERFORMED BY: Myself IMAGES ARCHIVED?: No LIMITATIONS: none PREGNANCY FREE FLUID: Present ADNEXAL FINDINGS: GESTATIONAL AGE, ESTIMATE:  FETAL HEART RATE: 156 INTERPRETATION: reassuring [place]       PROCEDURES:  Critical Care performed:   Procedures   MEDICATIONS ORDERED IN ED: Medications - No data to display   IMPRESSION / MDM / ASSESSMENT AND PLAN / ED COURSE  I reviewed the triage vital signs and the nursing notes.                              Differential diagnosis includes, but is not limited to, URI, COVID, pneumonia, flu  Patient presented to the ER for several days of URI symptoms.  She did test positive  for COVID.  She is exceedingly well-appearing benign exam.  She is 4 to 5 days since onset of symptoms and symptoms are very mild do not feel that further diagnostic testing clinically indicated.  Did offer a course of Paxlovid but we discussed that the benefit may not be as great as she is already almost 5 days into her illness with fairly mild symptoms.  Her bedside ultrasound shows reassuring fetal heart tones.  Discussed case with midwife on for [facility].  No indication for further monitoring.  Patient declining Paxlovid and prefer symptomatic management which I think is reasonable.  Discussed signs symptoms for which the patient should return to the ER.     FINAL CLINICAL IMPRESSION(S) / ED DIAGNOSES   Final diagnoses:  COVID-19     Rx / DC Orders   ED Discharge Orders     None        Note:  This document was prepared using Dragon voice  recognition software and may include unintentional dictation errors.    Willy Eddy, MD 01/01/22 1213

## 2022-01-01 NOTE — ED Notes (Signed)
See triage note  States she developed fever and nasal congestion couple of days ago  Afebrile on arrival

## 2022-01-01 NOTE — Telephone Encounter (Signed)
Catherine Stewart called triage, stating she's been having higher fevers 101 and higher, been taking tylenol It does help and break the fever however having really bad body aches.  Spoke with Pattricia Boss she's advised it would be best she head over to L&D  to be checked out and get some heart tones on baby.

## 2022-01-02 NOTE — Telephone Encounter (Signed)
Patient called.  Patient aware.  

## 2022-01-04 ENCOUNTER — Encounter: Payer: Medicaid Other | Admitting: Obstetrics

## 2022-01-15 ENCOUNTER — Encounter: Payer: Self-pay | Admitting: Licensed Practical Nurse

## 2022-01-15 ENCOUNTER — Ambulatory Visit (INDEPENDENT_AMBULATORY_CARE_PROVIDER_SITE_OTHER): Payer: Medicaid Other | Admitting: Licensed Practical Nurse

## 2022-01-15 VITALS — BP 100/64 | Wt 178.0 lb

## 2022-01-15 DIAGNOSIS — Z131 Encounter for screening for diabetes mellitus: Secondary | ICD-10-CM

## 2022-01-15 DIAGNOSIS — Z3A26 26 weeks gestation of pregnancy: Secondary | ICD-10-CM

## 2022-01-15 DIAGNOSIS — Z3402 Encounter for supervision of normal first pregnancy, second trimester: Secondary | ICD-10-CM

## 2022-01-15 LAB — POCT URINALYSIS DIPSTICK OB
Bilirubin, UA: NEGATIVE
Blood, UA: NEGATIVE
Glucose, UA: NEGATIVE
Ketones, UA: NEGATIVE
Leukocytes, UA: NEGATIVE
Nitrite, UA: NEGATIVE
POC,PROTEIN,UA: NEGATIVE
Urobilinogen, UA: 0.2 E.U./dL
pH, UA: 5 (ref 5.0–8.0)

## 2022-01-15 NOTE — Progress Notes (Unsigned)
Routine Prenatal Care Visit  Subjective  Catherine Stewart is a 21 y.o. G2P0010 at [redacted]w[redacted]d being seen today for ongoing prenatal care.  She is currently monitored for the following issues for this low-risk pregnancy and has Seasonal allergies on their problem list.  ----------------------------------------------------------------------------------- Patient reports no complaints.  Here with Greig Castilla. Doing well.  Watching videos on labor, reading about breastfeeding, her mother and family members have successfully breastfed.  -Reviewed plan going forward, her care will be done by the cnms-with the goal of meeting all of the midwives,  and labors are covered by the cnm on call with MD support. Contractions: Not present. Vag. Bleeding: None.  Movement: Present. Leaking Fluid {Actions; denies/reports/admits to:19208}.  ----------------------------------------------------------------------------------- The following portions of the patient's history were reviewed and updated as appropriate: allergies, current medications, past family history, past medical history, past social history, past surgical history and problem list. Problem list updated.  Objective  Blood pressure 100/64, weight 178 lb (80.7 kg), last menstrual period 06/19/2021, unknown if currently breastfeeding. Pregravid weight 135 lb (61.2 kg) Total Weight Gain 43 lb (19.5 kg) Urinalysis: Urine Protein    Urine Glucose    Fetal Status: Fetal Heart Rate (bpm): 153 Fundal Height: 25 cm Movement: Present     General:  Alert, oriented and cooperative. Patient is in no acute distress.  Skin: Skin is warm and dry. No rash noted.   Cardiovascular: Normal heart rate noted  Respiratory: Normal respiratory effort, no problems with respiration noted  Abdomen: Soft, gravid, appropriate for gestational age. Pain/Pressure: Absent     Pelvic:  Cervical exam deferred        Extremities: Normal range of motion.  Edema: None  Mental Status: Normal mood and  affect. Normal behavior. Normal judgment and thought content.   Assessment   21 y.o. G2P0010 at [redacted]w[redacted]d by  04/22/2022, by Ultrasound presenting for routine prenatal visit  Plan   second Problems (from 09/04/21 to present)     No problems associated with this episode.        Preterm labor symptoms and general obstetric precautions including but not limited to vaginal bleeding, contractions, leaking of fluid and fetal movement were reviewed in detail with the patient. Please refer to After Visit Summary for other counseling recommendations.   Return in about 2 weeks (around 01/29/2022) for ROB, 28 wk labs.  @MYSIGNATURE @

## 2022-02-01 ENCOUNTER — Encounter: Payer: Self-pay | Admitting: Certified Nurse Midwife

## 2022-02-01 ENCOUNTER — Other Ambulatory Visit: Payer: Medicaid Other

## 2022-02-01 ENCOUNTER — Ambulatory Visit (INDEPENDENT_AMBULATORY_CARE_PROVIDER_SITE_OTHER): Payer: Medicaid Other | Admitting: Certified Nurse Midwife

## 2022-02-01 VITALS — BP 115/75 | HR 89 | Wt 187.0 lb

## 2022-02-01 DIAGNOSIS — Z23 Encounter for immunization: Secondary | ICD-10-CM | POA: Diagnosis not present

## 2022-02-01 DIAGNOSIS — Z3A28 28 weeks gestation of pregnancy: Secondary | ICD-10-CM

## 2022-02-01 DIAGNOSIS — Z3402 Encounter for supervision of normal first pregnancy, second trimester: Secondary | ICD-10-CM

## 2022-02-01 DIAGNOSIS — Z131 Encounter for screening for diabetes mellitus: Secondary | ICD-10-CM

## 2022-02-01 DIAGNOSIS — Z3483 Encounter for supervision of other normal pregnancy, third trimester: Secondary | ICD-10-CM

## 2022-02-01 LAB — POCT URINALYSIS DIPSTICK OB
Bilirubin, UA: NEGATIVE
Blood, UA: NEGATIVE
Glucose, UA: NEGATIVE
Ketones, UA: NEGATIVE
Leukocytes, UA: NEGATIVE
Nitrite, UA: NEGATIVE
POC,PROTEIN,UA: NEGATIVE
Spec Grav, UA: <=1.005 — AB (ref 1.010–1.025)
Urobilinogen, UA: 0.2 E.U./dL
pH, UA: >=9 — AB (ref 5.0–8.0)

## 2022-02-01 NOTE — Addendum Note (Signed)
Addended by: Liliane Shi on: 02/01/2022 09:08 AM   Modules accepted: Orders

## 2022-02-01 NOTE — Patient Instructions (Signed)
Td (Tetanus, Diphtheria) Vaccine: What You Need to Know 1. Why get vaccinated? Td vaccine can prevent tetanus and diphtheria. Tetanus enters the body through cuts or wounds. Diphtheria spreads from person to person. TETANUS (T) causes painful stiffening of the muscles. Tetanus can lead to serious health problems, including being unable to open the mouth, having trouble swallowing and breathing, or death. DIPHTHERIA (D) can lead to difficulty breathing, heart failure, paralysis, or death. 2. Td vaccine Td is only for children 7 years and older, adolescents, and adults.  Td is usually given as a booster dose every 10 years, or after 5 years in the case of a severe or dirty wound or burn. Another vaccine, called "Tdap," may be used instead of Td. Tdap protects against pertussis, also known as "whooping cough," in addition to tetanus and diphtheria. Td may be given at the same time as other vaccines. 3. Talk with your health care provider Tell your vaccination provider if the person getting the vaccine: Has had an allergic reaction after a previous dose of any vaccine that protects against tetanus or diphtheria, or has any severe, life-threatening allergies Has ever had Guillain-Barr Syndrome (also called "GBS") Has had severe pain or swelling after a previous dose of any vaccine that protects against tetanus or diphtheria In some cases, your health care provider may decide to postpone Td vaccination until a future visit. People with minor illnesses, such as a cold, may be vaccinated. People who are moderately or severely ill should usually wait until they recover before getting Td vaccine.  Your health care provider can give you more information. 4. Risks of a vaccine reaction Pain, redness, or swelling where the shot was given, mild fever, headache, feeling tired, and nausea, vomiting, diarrhea, or stomachache sometimes happen after Td vaccination. People sometimes faint after medical procedures,  including vaccination. Tell your provider if you feel dizzy or have vision changes or ringing in the ears.  As with any medicine, there is a very remote chance of a vaccine causing a severe allergic reaction, other serious injury, or death. 5. What if there is a serious problem? An allergic reaction could occur after the vaccinated person leaves the clinic. If you see signs of a severe allergic reaction (hives, swelling of the face and throat, difficulty breathing, a fast heartbeat, dizziness, or weakness), call 9-1-1 and get the person to the nearest hospital.  For other signs that concern you, call your health care provider.  Adverse reactions should be reported to the Vaccine Adverse Event Reporting System (VAERS). Your health care provider will usually file this report, or you can do it yourself. Visit the VAERS website at www.vaers.hhs.gov or call 1-800-822-7967. VAERS is only for reporting reactions, and VAERS staff members do not give medical advice. 6. The National Vaccine Injury Compensation Program The National Vaccine Injury Compensation Program (VICP) is a federal program that was created to compensate people who may have been injured by certain vaccines. Claims regarding alleged injury or death due to vaccination have a time limit for filing, which may be as short as two years. Visit the VICP website at www.hrsa.gov/vaccinecompensation or call 1-800-338-2382 to learn about the program and about filing a claim. 7. How can I learn more? Ask your health care provider. Call your local or state health department. Visit the website of the Food and Drug Administration (FDA) for vaccine package inserts and additional information at www.fda.gov/vaccines-blood-biologics/vaccines. Contact the Centers for Disease Control and Prevention (CDC): Call 1-800-232-4636 (1-800-CDC-INFO) or Visit   CDC's website at http://hunter.com/. Source: CDC Vaccine Information Statement Td (Tetanus, Diphtheria) Vaccine  (09/25/2019) This same material is available at http://www.wolf.info/ for no charge. This information is not intended to replace advice given to you by your health care provider. Make sure you discuss any questions you have with your health care provider. Document Revised: 01/03/2021 Document Reviewed: 11/07/2020 Elsevier Patient Education  [place]. Oral Glucose Tolerance Test During Pregnancy Why am I having this test? The oral glucose tolerance test (OGTT) is done to check how your body processes blood sugar (glucose). This is one of several tests used to diagnose diabetes that develops during pregnancy (gestational diabetes mellitus). Gestational diabetes is a short-term form of diabetes that some women develop while they are pregnant. It usually occurs during the second trimester of pregnancy and goes away after delivery. Testing, or screening, for gestational diabetes usually occurs at weeks 24-28 of pregnancy. You may have the OGTT test after having a 1-hour glucose screening test if the results from that test indicate that you may have gestational diabetes. This test may also be needed if: You have a history of gestational diabetes. There is a history of giving birth to very large babies or of losing pregnancies (having stillbirths). You have signs and symptoms of diabetes, such as: Changes in your eyesight. Tingling or numbness in your hands or feet. Changes in hunger, thirst, and urination, and these are not explained by your pregnancy. What is being tested? This test measures the amount of glucose in your blood at different times during a period of 3 hours. This shows how well your body can process glucose. What kind of sample is taken?  Blood samples are required for this test. They are usually collected by inserting a needle into a blood vessel. How do I prepare for this test? For 3 days before your test, eat normally. Have plenty of carbohydrate-rich foods. Follow instructions from  your health care provider about: Eating or drinking restrictions on the day of the test. You may be asked not to eat or drink anything other than water (to fast) starting 8-10 hours before the test. Changing or stopping your regular medicines. Some medicines may interfere with this test. Tell a health care provider about: All medicines you are taking, including vitamins, herbs, eye drops, creams, and over-the-counter medicines. Any blood disorders you have. Any surgeries you have had. Any medical conditions you have. What happens during the test? First, your blood glucose will be measured. This is referred to as your fasting blood glucose because you fasted before the test. Then, you will drink a glucose solution that contains a certain amount of glucose. Your blood glucose will be measured again 1, 2, and 3 hours after you drink the solution. This test takes about 3 hours to complete. You will need to stay at the testing location during this time. During the testing period: Do not eat or drink anything other than the glucose solution. Do not exercise. Do not use any products that contain nicotine or tobacco, such as cigarettes, e-cigarettes, and chewing tobacco. These can affect your test results. If you need help quitting, ask your health care provider. The testing procedure may vary among health care providers and hospitals. How are the results reported? Your results will be reported as milligrams of glucose per deciliter of blood (mg/dL) or millimoles per liter (mmol/L). There is more than one source for screening and diagnosis reference values used to diagnose gestational diabetes. Your health care provider will  compare your results to normal values that were established after testing a large group of people (reference values). Reference values may vary among labs and hospitals. For this test (Carpenter-Coustan), reference values are: Fasting: 95 mg/dL (5.3 mmol/L). 1 hour: 180 mg/dL (63.7  mmol/L). 2 hour: 155 mg/dL (8.6 mmol/L). 3 hour: 140 mg/dL (7.8 mmol/L). What do the results mean? Results below the reference values are considered normal. If two or more of your blood glucose levels are at or above the reference values, you may be diagnosed with gestational diabetes. If only one level is high, your health care provider may suggest repeat testing or other tests to confirm a diagnosis. Talk with your health care provider about what your results mean. Questions to ask your health care provider Ask your health care provider, or the department that is doing the test: When will my results be ready? How will I get my results? What are my treatment options? What other tests do I need? What are my next steps? Summary The oral glucose tolerance test (OGTT) is one of several tests used to diagnose diabetes that develops during pregnancy (gestational diabetes mellitus). Gestational diabetes is a short-term form of diabetes that some women develop while they are pregnant. You may have the OGTT test after having a 1-hour glucose screening test if the results from that test show that you may have gestational diabetes. You may also have this test if you have any symptoms or risk factors for this type of diabetes. Talk with your health care provider about what your results mean. This information is not intended to replace advice given to you by your health care provider. Make sure you discuss any questions you have with your health care provider. Document Revised: 09/12/2021 Document Reviewed: 07/16/2019 Elsevier Patient Education  2023 [employer].

## 2022-02-01 NOTE — Progress Notes (Addendum)
ROB doing well. Feels good movement. 28 wk labs today: Glucose screen/RPR/CBC. Tdap, Blood transfusion consent completed, all questions answered. Sample birth plan given, will follow up in upcoming visits. Discussed birth control after delivery, information given.  Discussed wt gain, reviewed healthy diet in pregnancy and encouraged regular exercise.   Follow up 2 wk  for ROB or sooner if needed.    Doreene Burke, CNM

## 2022-02-02 LAB — 28 WEEK RH+PANEL
Basophils Absolute: 0 10*3/uL (ref 0.0–0.2)
Basos: 0 %
EOS (ABSOLUTE): 0.1 10*3/uL (ref 0.0–0.4)
Eos: 2 %
Gestational Diabetes Screen: 97 mg/dL (ref 70–139)
HIV Screen 4th Generation wRfx: NONREACTIVE
Hematocrit: 35.5 % (ref 34.0–46.6)
Hemoglobin: 11.6 g/dL (ref 11.1–15.9)
Immature Grans (Abs): 0.1 10*3/uL (ref 0.0–0.1)
Immature Granulocytes: 1 %
Lymphocytes Absolute: 1.6 10*3/uL (ref 0.7–3.1)
Lymphs: 18 %
MCH: 30.9 pg (ref 26.6–33.0)
MCHC: 32.7 g/dL (ref 31.5–35.7)
MCV: 95 fL (ref 79–97)
Monocytes Absolute: 0.6 10*3/uL (ref 0.1–0.9)
Monocytes: 6 %
Neutrophils Absolute: 6.5 10*3/uL (ref 1.4–7.0)
Neutrophils: 73 %
Platelets: 257 10*3/uL (ref 150–450)
RBC: 3.75 x10E6/uL — ABNORMAL LOW (ref 3.77–5.28)
RDW: 11.3 % — ABNORMAL LOW (ref 11.7–15.4)
RPR Ser Ql: NONREACTIVE
WBC: 8.9 10*3/uL (ref 3.4–10.8)

## 2022-02-06 ENCOUNTER — Encounter: Payer: Self-pay | Admitting: Certified Nurse Midwife

## 2022-02-19 NOTE — L&D Delivery Note (Signed)
         Delivery Note   Catherine Stewart is a 22 y.o. G2P0010 at [redacted]w[redacted]d Estimated Date of Delivery: 04/22/22  PRE-OPERATIVE DIAGNOSIS:  1) [redacted]w[redacted]d pregnancy.   POST-OPERATIVE DIAGNOSIS:  1) [redacted]w[redacted]d pregnancy s/p Vaginal, Spontaneous   Delivery Type: Vaginal, Spontaneous    Delivery Anesthesia: Epidural   Labor Complications:  none    ESTIMATED BLOOD LOSS: 300 ml    FINDINGS:   1) female infant, Apgar scores of 2   at 1 minute and 9   at 5 minutes and a birthweight of 142.51  ounces.    2) Nuchal cord: yes x 1, reduced   SPECIMENS:   PLACENTA:   Appearance: Intact , 3 vessel cord    Removal: Spontaneous      Disposition:  per protocol   DISPOSITION:  Infant to left in stable condition in the delivery room, with L&D personnel and mother,  NARRATIVE SUMMARY: Labor course:  [name] is a G2P0010 at [redacted]w[redacted]d who presented for induction of labor.  She progressed well in labor with pitocin.  She received the appropriate epidural  anesthesia and proceeded to complete dilation. She evidenced good maternal expulsive effort during the second stage. She went on to deliver a viable female infant " [name]". The placenta delivered without problems and was noted to be complete. A perineal and vaginal examination was performed. Episiotomy/Lacerations:   Bilateral labial abrasions, hemostatic not in need of repair. The patient tolerated this well.  Philip Aspen, CNM  04/30/2022 3:03 PM

## 2022-02-21 ENCOUNTER — Encounter: Payer: Self-pay | Admitting: Obstetrics

## 2022-02-21 ENCOUNTER — Ambulatory Visit (INDEPENDENT_AMBULATORY_CARE_PROVIDER_SITE_OTHER): Payer: Medicaid Other | Admitting: Obstetrics

## 2022-02-21 VITALS — BP 113/73 | HR 85 | Wt 191.0 lb

## 2022-02-21 DIAGNOSIS — Z3A31 31 weeks gestation of pregnancy: Secondary | ICD-10-CM

## 2022-02-21 DIAGNOSIS — Z3483 Encounter for supervision of other normal pregnancy, third trimester: Secondary | ICD-10-CM

## 2022-02-21 DIAGNOSIS — Z3481 Encounter for supervision of other normal pregnancy, first trimester: Secondary | ICD-10-CM

## 2022-02-21 LAB — POCT URINALYSIS DIPSTICK OB
Bilirubin, UA: NEGATIVE
Blood, UA: NEGATIVE
Glucose, UA: NEGATIVE
Ketones, UA: NEGATIVE
Leukocytes, UA: NEGATIVE
Nitrite, UA: NEGATIVE
POC,PROTEIN,UA: NEGATIVE
Spec Grav, UA: 1.02 (ref 1.010–1.025)
Urobilinogen, UA: 0.2 E.U./dL — AB
pH, UA: 6 (ref 5.0–8.0)

## 2022-02-21 NOTE — Progress Notes (Signed)
ROB at [redacted]w[redacted]d. Denies ctx, LOF, and vaginal bleeding. Catherine Stewart is feeling well but is often tired. She reports that her employer is often not following lifting restrictions she was given. Encouraged to continue to advocate for herself and refer to medical note she has received, and to use good body mechanics, support belt, and compressions stockings. Reviewed normal 28-week lab results. Discussed birth plan; Catherine Stewart desires epidural. Encouraged CBE. RTC in 2 weeks.  Lloyd Huger, CNM

## 2022-02-27 ENCOUNTER — Emergency Department
Admission: EM | Admit: 2022-02-27 | Discharge: 2022-02-27 | Disposition: A | Discharge status: Home / Self Care | Payer: Managed Care, Other (non HMO) | Attending: Emergency Medicine | Admitting: Emergency Medicine

## 2022-02-27 ENCOUNTER — Encounter: Payer: Self-pay | Admitting: Emergency Medicine

## 2022-02-27 ENCOUNTER — Telehealth: Payer: Self-pay | Admitting: Certified Nurse Midwife

## 2022-02-27 DIAGNOSIS — O26893 Other specified pregnancy related conditions, third trimester: Secondary | ICD-10-CM | POA: Insufficient documentation

## 2022-02-27 DIAGNOSIS — S60455A Superficial foreign body of left ring finger, initial encounter: Secondary | ICD-10-CM | POA: Diagnosis present

## 2022-02-27 DIAGNOSIS — O99891 Other specified diseases and conditions complicating pregnancy: Secondary | ICD-10-CM | POA: Diagnosis not present

## 2022-02-27 DIAGNOSIS — S60449A External constriction of unspecified finger, initial encounter: Secondary | ICD-10-CM

## 2022-02-27 DIAGNOSIS — Z3A36 36 weeks gestation of pregnancy: Secondary | ICD-10-CM | POA: Insufficient documentation

## 2022-02-27 DIAGNOSIS — X58XXXA Exposure to other specified factors, initial encounter: Secondary | ICD-10-CM | POA: Insufficient documentation

## 2022-02-27 NOTE — ED Provider Notes (Signed)
[facility] Provider Note    Event Date/Time   First MD Initiated Contact with Patient 02/27/22 [phone]     (approximate)   History   No chief complaint on file.   HPI  Catherine Stewart is a 22 y.o. female who presents to the ED for evaluation of No chief complaint on file.   Patient presents with her husband for evaluation of her ring on her left fourth finger being stuck and with increasing swelling and her unable to get it off.  She is requesting to cut it off.  She is [redacted] weeks pregnant and blames the readings second finger with the swelling associated with this gestation.  No abdominal pain, headache, syncope, fever or other complaints   Physical Exam   Triage Vital Signs: ED Triage Vitals [02/27/22 0613]  Enc Vitals Group     BP 122/80     Pulse Rate 89     Resp 18     Temp 98.2 F (36.8 C)     Temp src      SpO2 100 %     Weight      Height      Head Circumference      Peak Flow      Pain Score      Pain Loc      Pain Edu?      Excl. in [place]?     Most recent vital signs: Vitals:   02/27/22 0613  BP: 122/80  Pulse: 89  Resp: 18  Temp: 98.2 F (36.8 C)  SpO2: 100%    General: Awake, no distress.  CV:  Good peripheral perfusion.  Resp:  Normal effort.  Abd:  No distention.  MSK:  No deformity noted.  Ring stuck to the base of the left fourth finger, swollen and erythematous distally with slow capillary refill and altered soft touch sensation Neuro:  No focal deficits appreciated. Other:     ED Results / Procedures / Treatments   Labs (all labs ordered are listed, but only abnormal results are displayed) Labs Reviewed - No data to display  EKG   RADIOLOGY   Official radiology report(s): No results found.  PROCEDURES and INTERVENTIONS:  .Foreign Body Removal  Date/Time: 02/27/2022 6:27 AM  Performed by: Vladimir Crofts, MD Authorized by: Vladimir Crofts, MD  Consent: Verbal consent obtained. Risks and benefits: risks,  benefits and alternatives were discussed Consent given by: patient Complexity: simple 1 objects recovered. Objects recovered: Drink Post-procedure assessment: foreign body removed Patient tolerance: patient tolerated the procedure well with no immediate complications Comments: Utilizing the ring cutters built into my Raptor trauma shears is able to effectively remove the ring stuck on her finger    Medications - No data to display   IMPRESSION / MDM / [place] / ED COURSE  I reviewed the triage vital signs and the nursing notes.  Ring removed and provided back to the patient.  Single cut along the posterior aspect of the band and a bend the band sufficient for removal without removing additional chunks.      FINAL CLINICAL IMPRESSION(S) / ED DIAGNOSES   Final diagnoses:  Tight ring on finger     Rx / DC Orders   ED Discharge Orders     None        Note:  This document was prepared using Dragon voice recognition software and may include unintentional dictation errors.   Vladimir Crofts, MD 02/27/22 [phone]

## 2022-02-27 NOTE — Telephone Encounter (Signed)
I contacted patient via phone. Scheduled change for 1/17 with Deneise Lever. Voicemail is full unable to leave message.

## 2022-02-27 NOTE — ED Triage Notes (Signed)
Pt presents via POV with complaints of wedding band getting stuck on her finger this AM. She tried home remedies - using lotion, lube and even a string and its been stuck for almost an hour.

## 2022-03-01 NOTE — Telephone Encounter (Signed)
Pt has been rescheduled to 03/09/22 at 3:55 with Missy.

## 2022-03-07 ENCOUNTER — Encounter: Payer: Medicaid Other | Admitting: Certified Nurse Midwife

## 2022-03-09 ENCOUNTER — Encounter: Payer: Medicaid Other | Admitting: Obstetrics

## 2022-03-09 DIAGNOSIS — Z3403 Encounter for supervision of normal first pregnancy, third trimester: Secondary | ICD-10-CM

## 2022-03-12 ENCOUNTER — Ambulatory Visit (INDEPENDENT_AMBULATORY_CARE_PROVIDER_SITE_OTHER): Payer: Medicaid Other | Admitting: Obstetrics

## 2022-03-12 ENCOUNTER — Encounter: Payer: Self-pay | Admitting: Obstetrics

## 2022-03-12 VITALS — BP 118/76 | HR 85 | Wt 196.0 lb

## 2022-03-12 DIAGNOSIS — Z3A34 34 weeks gestation of pregnancy: Secondary | ICD-10-CM

## 2022-03-12 DIAGNOSIS — Z3483 Encounter for supervision of other normal pregnancy, third trimester: Secondary | ICD-10-CM

## 2022-03-12 LAB — POCT URINALYSIS DIPSTICK OB
Bilirubin, UA: NEGATIVE
Blood, UA: NEGATIVE
Glucose, UA: NEGATIVE
Ketones, UA: NEGATIVE
Leukocytes, UA: NEGATIVE
Nitrite, UA: NEGATIVE
POC,PROTEIN,UA: NEGATIVE
Spec Grav, UA: 1.02 (ref 1.010–1.025)
Urobilinogen, UA: 0.2 E.U./dL — NL
pH, UA: 6 (ref 5.0–8.0)

## 2022-03-12 NOTE — Progress Notes (Signed)
Catherine Stewart at [redacted]w[redacted]d. Active baby. Denies ctx, LOF, and vaginal bleeding. Catherine Stewart has been feeling tired and work has not been accommodating of restrictions. Catherine Stewart reports that she has had a watery discharge leaking from her ears for the past few months. She denies pain, loss of hearing, fever, congestion, and illness. Recommend f/u with PCP. Discussed s/s of labor and when to go to the hospital. Discussed GBS and GC/chlamydia swabs at next visit.  RTC in 2 weeks.  Lurlean Horns, CNM

## 2022-03-26 ENCOUNTER — Ambulatory Visit (INDEPENDENT_AMBULATORY_CARE_PROVIDER_SITE_OTHER): Payer: Medicaid Other | Admitting: Obstetrics

## 2022-03-26 ENCOUNTER — Other Ambulatory Visit (HOSPITAL_COMMUNITY)
Admission: RE | Admit: 2022-03-26 | Discharge: 2022-03-26 | Disposition: A | Discharge status: Home / Self Care | Payer: Managed Care, Other (non HMO) | Source: Ambulatory Visit | Attending: Obstetrics | Admitting: Obstetrics

## 2022-03-26 VITALS — BP 126/89 | HR 99 | Wt 200.4 lb

## 2022-03-26 DIAGNOSIS — Z3A36 36 weeks gestation of pregnancy: Secondary | ICD-10-CM

## 2022-03-26 DIAGNOSIS — Z3403 Encounter for supervision of normal first pregnancy, third trimester: Secondary | ICD-10-CM | POA: Diagnosis not present

## 2022-03-26 LAB — POCT URINALYSIS DIPSTICK
Bilirubin, UA: NEGATIVE
Blood, UA: NEGATIVE
Glucose, UA: NEGATIVE
Ketones, UA: NEGATIVE
Leukocytes, UA: NEGATIVE
Nitrite, UA: NEGATIVE
Protein, UA: NEGATIVE
Spec Grav, UA: 1.01 (ref 1.010–1.025)
Urobilinogen, UA: 1 E.U./dL
pH, UA: 6 (ref 5.0–8.0)

## 2022-03-26 NOTE — Progress Notes (Signed)
ROB at [redacted]w[redacted]d. Active baby. Denies ctx, LOF, and vaginal bleeding. 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 They are still deciding on a pediatrician; handout of local providers given. Will get a car seat soon. Herbal labor prep handout given. GBS w/sensitivities and GC/chlamydia swabs self-collected. Reviewed s/s of labor and when to go to the hospital. RTC in one week.  Lurlean Horns, CNM

## 2022-03-28 LAB — CERVICOVAGINAL ANCILLARY ONLY
Chlamydia: NEGATIVE
Comment: NEGATIVE
Comment: NORMAL
Neisseria Gonorrhea: NEGATIVE

## 2022-03-30 LAB — STREP GP B CULTURE+RFLX: Strep Gp B Culture+Rflx: NEGATIVE

## 2022-04-03 ENCOUNTER — Ambulatory Visit (INDEPENDENT_AMBULATORY_CARE_PROVIDER_SITE_OTHER): Payer: Medicaid Other | Admitting: Obstetrics and Gynecology

## 2022-04-03 ENCOUNTER — Encounter: Payer: Self-pay | Admitting: Obstetrics and Gynecology

## 2022-04-03 VITALS — BP 110/73 | HR 89 | Wt 203.9 lb

## 2022-04-03 DIAGNOSIS — Z3A37 37 weeks gestation of pregnancy: Secondary | ICD-10-CM

## 2022-04-03 DIAGNOSIS — Z3403 Encounter for supervision of normal first pregnancy, third trimester: Secondary | ICD-10-CM

## 2022-04-03 LAB — POCT URINALYSIS DIPSTICK OB
Bilirubin, UA: NEGATIVE
Blood, UA: NEGATIVE
Glucose, UA: NEGATIVE
Ketones, UA: NEGATIVE
Leukocytes, UA: NEGATIVE
Nitrite, UA: NEGATIVE
POC,PROTEIN,UA: NEGATIVE
Spec Grav, UA: 1.015 (ref 1.010–1.025)
Urobilinogen, UA: 0.2 E.U./dL
pH, UA: 6.5 (ref 5.0–8.0)

## 2022-04-03 NOTE — Progress Notes (Signed)
Catherine Stewart. Patient states daily fetal movement with occasional braxton hicks. She states no questions or concerns at this time.

## 2022-04-03 NOTE — Progress Notes (Signed)
ROB: No complaints.  Not having regular contractions yet.  Reports daily fetal movement.  Signs and symptoms of labor reviewed.  Possible cervical check next week.

## 2022-04-06 ENCOUNTER — Telehealth: Payer: Self-pay

## 2022-04-06 NOTE — Telephone Encounter (Signed)
Pt called triage wanting to let us know she's been cramping, but can't really time them since the pain doesn't go away completely. Denies any vaginal fluid, spotting/bleeding. Advised to go to ER if bleeding or severe cramping develops and to try and time the start of one contraction to the other.

## 2022-04-09 ENCOUNTER — Ambulatory Visit (INDEPENDENT_AMBULATORY_CARE_PROVIDER_SITE_OTHER): Payer: Medicaid Other | Admitting: Licensed Practical Nurse

## 2022-04-09 ENCOUNTER — Encounter: Payer: Self-pay | Admitting: Licensed Practical Nurse

## 2022-04-09 VITALS — BP 121/67 | HR 90 | Wt 204.3 lb

## 2022-04-09 DIAGNOSIS — Z3A38 38 weeks gestation of pregnancy: Secondary | ICD-10-CM

## 2022-04-09 DIAGNOSIS — Z3403 Encounter for supervision of normal first pregnancy, third trimester: Secondary | ICD-10-CM

## 2022-04-09 LAB — POCT URINALYSIS DIPSTICK
Bilirubin, UA: NEGATIVE
Blood, UA: NEGATIVE
Glucose, UA: NEGATIVE
Ketones, UA: NEGATIVE
Leukocytes, UA: NEGATIVE
Nitrite, UA: NEGATIVE
Protein, UA: NEGATIVE
Spec Grav, UA: 1.015 (ref 1.010–1.025)
Urobilinogen, UA: 1 E.U./dL
pH, UA: 7.5 (ref 5.0–8.0)

## 2022-04-09 NOTE — Progress Notes (Signed)
Routine Prenatal Care Visit  Subjective  Catherine Stewart is a 22 y.o. G2P0010 at 59w1dbeing seen today for ongoing prenatal care.  She is currently monitored for the following issues for this low-risk pregnancy and has Seasonal allergies and Encounter for supervision of normal first pregnancy in third trimester on their problem list.  ----------------------------------------------------------------------------------- Patient reports no complaints.  Here with AMitzi Hansen Feeling pretty good. Having some B-H ctx's. Feels ready for baby. AMitzi Hansenwill be off for 1 week after the birth. They have family that will be available to help as needed.  -Would like to wait a few years before the next child, planning [place]  Contractions: Irritability. Vag. Bleeding: None.  Movement: Present. Leaking Fluid denies.  ----------------------------------------------------------------------------------- The following portions of the patient's history were reviewed and updated as appropriate: allergies, current medications, past family history, past medical history, past social history, past surgical history and problem list. Problem list updated.  Objective  Blood pressure 121/67, pulse 90, weight 204 lb 4.8 oz (92.7 kg), last menstrual period 06/19/2021, unknown if currently breastfeeding. Pregravid weight 135 lb (61.2 kg) Total Weight Gain 69 lb 4.8 oz (31.4 kg) Urinalysis: Urine Protein    Urine Glucose    Fetal Status: Fetal Heart Rate (bpm): 130 Fundal Height: 40 cm Movement: Present     General:  Alert, oriented and cooperative. Patient is in no acute distress.  Skin: Skin is warm and dry. No rash noted.   Cardiovascular: Normal heart rate noted  Respiratory: Normal respiratory effort, no problems with respiration noted  Abdomen: Soft, gravid, appropriate for gestational age. Pain/Pressure: Absent     Pelvic:  Cervical exam deferred        Extremities: Normal range of motion.  Edema: Trace  Mental Status:  Normal mood and affect. Normal behavior. Normal judgment and thought content.   Assessment   22y.o. G2P0010 at 357w1dy  04/22/2022, by Ultrasound presenting for routine prenatal visit  Plan   second Problems (from 09/04/21 to present)     No problems associated with this episode.        Term labor symptoms and general obstetric precautions including but not limited to vaginal bleeding, contractions, leaking of fluid and fetal movement were reviewed in detail with the patient. Please refer to After Visit Summary for other counseling recommendations.   Return in about 1 year (around 04/10/2023) for [facility]  04/09/22  4:32 PM

## 2022-04-16 ENCOUNTER — Ambulatory Visit (INDEPENDENT_AMBULATORY_CARE_PROVIDER_SITE_OTHER): Payer: Medicaid Other | Admitting: Obstetrics & Gynecology

## 2022-04-16 VITALS — BP 129/85 | HR 101 | Wt 202.0 lb

## 2022-04-16 DIAGNOSIS — Z3483 Encounter for supervision of other normal pregnancy, third trimester: Secondary | ICD-10-CM

## 2022-04-16 DIAGNOSIS — Z3A39 39 weeks gestation of pregnancy: Secondary | ICD-10-CM

## 2022-04-16 DIAGNOSIS — O2603 Excessive weight gain in pregnancy, third trimester: Secondary | ICD-10-CM

## 2022-04-16 LAB — POCT URINALYSIS DIPSTICK OB
Bilirubin, UA: NEGATIVE
Blood, UA: NEGATIVE
Glucose, UA: NEGATIVE
Ketones, UA: NEGATIVE
Leukocytes, UA: NEGATIVE
Nitrite, UA: NEGATIVE
POC,PROTEIN,UA: NEGATIVE
Spec Grav, UA: 1.01 (ref 1.010–1.025)
Urobilinogen, UA: 1 E.U./dL
pH, UA: 5.5 (ref 5.0–8.0)

## 2022-04-16 NOTE — Addendum Note (Signed)
Addended by: Quintella Baton D on: 04/16/2022 04:40 PM   Modules accepted: Orders

## 2022-04-16 NOTE — Progress Notes (Signed)
   PRENATAL VISIT NOTE  Subjective:  Catherine Stewart is a 22 y.o. married G2P0010 at 76w1dbeing seen today for ongoing prenatal care.  She is currently monitored for the following issues for this low-risk pregnancy and has Seasonal allergies and Encounter for supervision of normal first pregnancy in third trimester on their problem list.  Patient reports no complaints.  Contractions: Irregular. Vag. Bleeding: None.  Movement: Present. Denies leaking of fluid.   The following portions of the patient's history were reviewed and updated as appropriate: allergies, current medications, past family history, past medical history, past social history, past surgical history and problem list.   Objective:   Vitals:   04/16/22 1604  BP: 129/85  Pulse: (!) 101  Weight: 202 lb (91.6 kg)    Fetal Status:     Movement: Present     General:  Alert, oriented and cooperative. Patient is in no acute distress.  Skin: Skin is warm and dry. No rash noted.   Cardiovascular: Normal heart rate noted  Respiratory: Normal respiratory effort, no problems with respiration noted  Abdomen: Soft, gravid, appropriate for gestational age.  Pain/Pressure: Absent     Pelvic: Closed/50%/high/soft/posterior     Extremities: Normal range of motion.     Mental Status: Normal mood and affect. Normal behavior. Normal judgment and thought content.   Assessment and Plan:  Pregnancy: G2P0010 at 328w1d. Encounter for supervision of other normal pregnancy in third trimester   2. [redacted] weeks gestation of pregnancy - She is able to recite the reasons to come to the hospital for labor  3. Excessive weight gain during pregnancy in third trimester   Term labor symptoms and general obstetric precautions including but not limited to vaginal bleeding, contractions, leaking of fluid and fetal movement were reviewed in detail with the patient. Please refer to After Visit Summary for other counseling recommendations.   Return in  about 1 week (around 04/23/2022).  No future appointments.  MyEmily FilbertMD

## 2022-04-22 IMAGING — CR DG ANKLE 2V *L*
1 series · 2 of 2 positions shown · non-contrast
Comparison: None.

CLINICAL DATA: Injury pain

EXAM:
LEFT ANKLE - 2 VIEW

[Series 1: dg ankle 2 views left · 0.14mm/px · 2 of 2 slices shown]
[im 1/2]
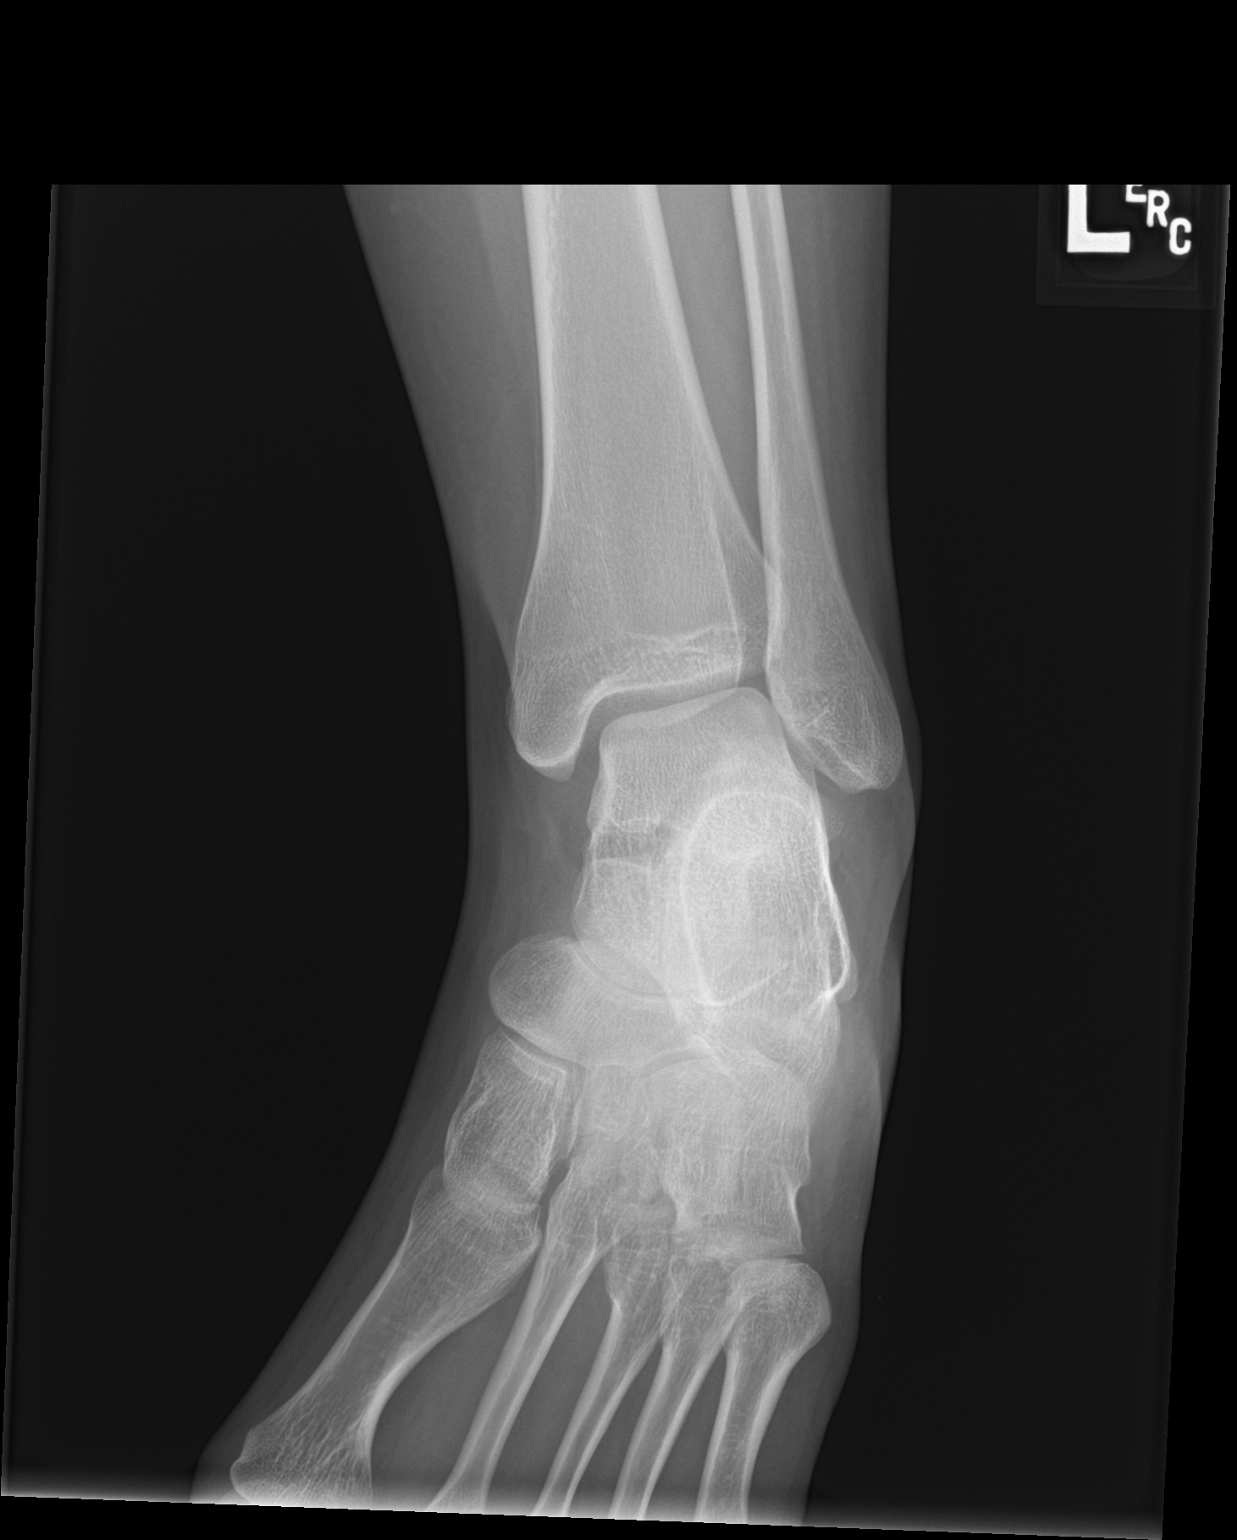
[im 2/2]
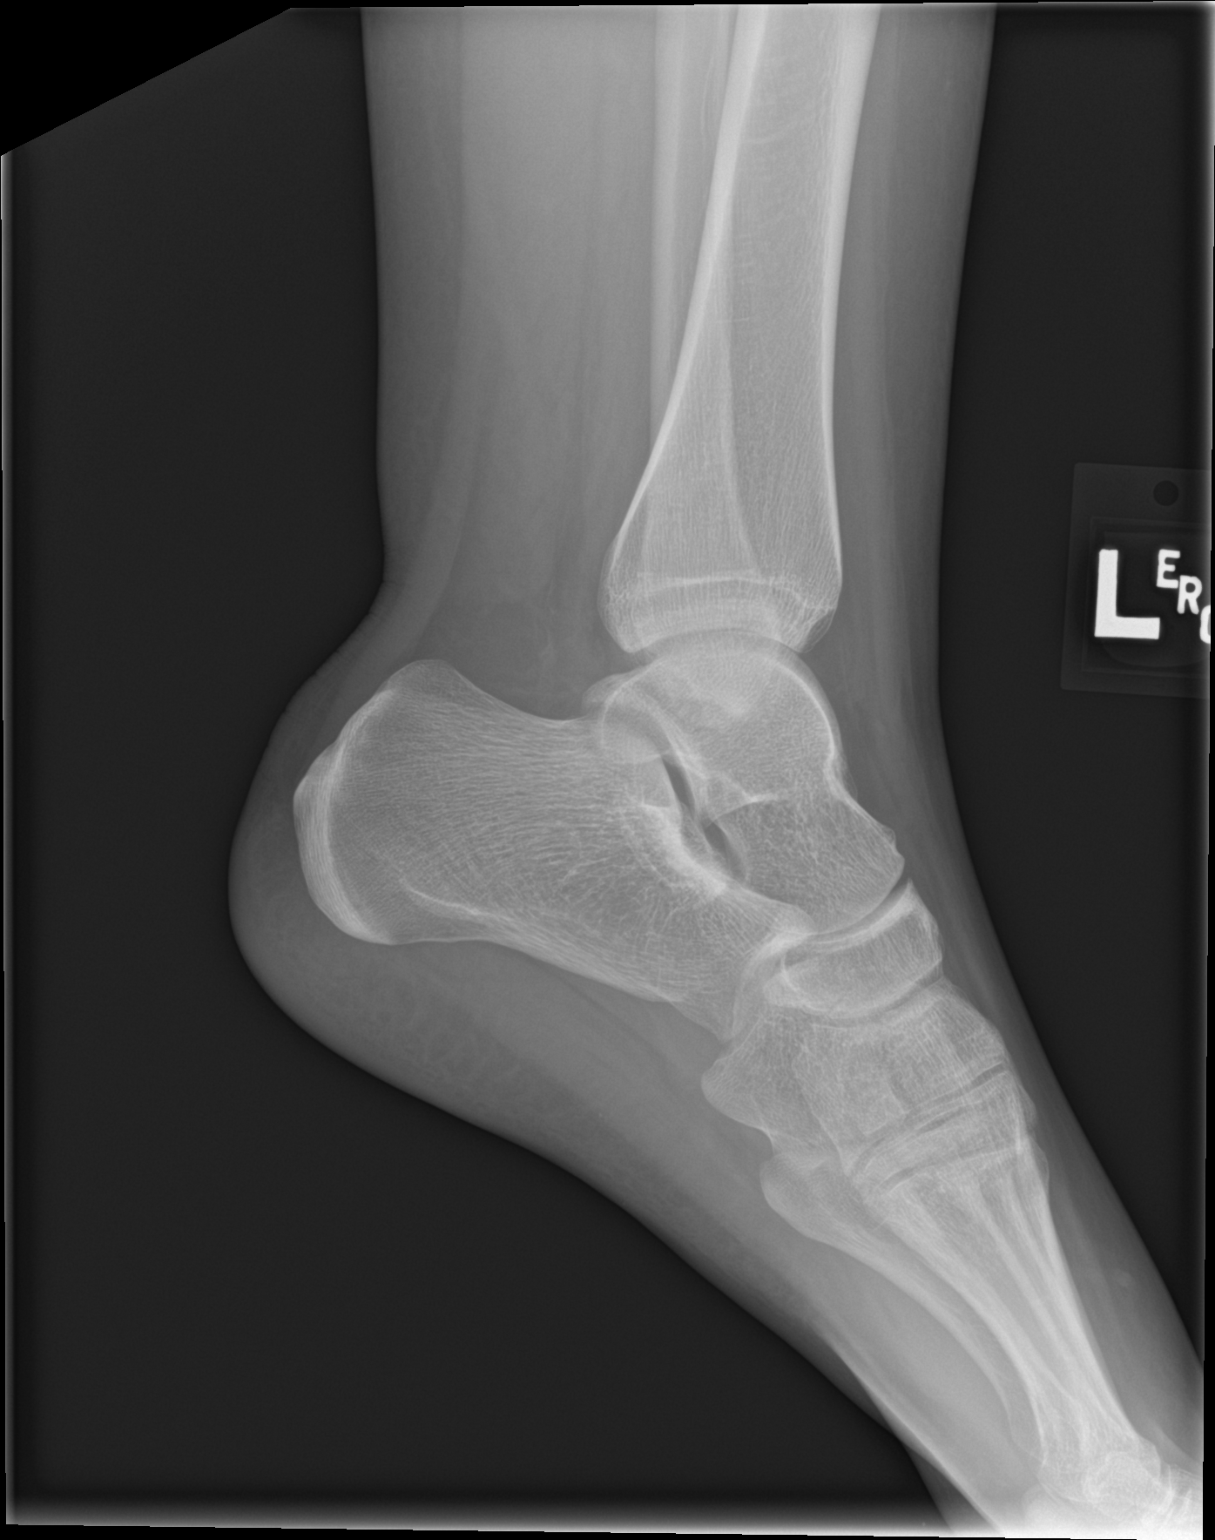

[2 of 2 positions shown; findings below may reference images not displayed]

FINDINGS: There is no evidence of fracture, dislocation, or joint effusion.
There is no evidence of arthropathy or other focal bone abnormality.
Soft tissues are unremarkable.
IMPRESSION: Negative.

## 2022-04-24 ENCOUNTER — Ambulatory Visit (INDEPENDENT_AMBULATORY_CARE_PROVIDER_SITE_OTHER): Payer: Medicaid Other | Admitting: Obstetrics

## 2022-04-24 VITALS — BP 111/72 | HR 88 | Wt 201.9 lb

## 2022-04-24 DIAGNOSIS — O480 Post-term pregnancy: Secondary | ICD-10-CM

## 2022-04-24 DIAGNOSIS — Z3A40 40 weeks gestation of pregnancy: Secondary | ICD-10-CM

## 2022-04-24 DIAGNOSIS — Z3483 Encounter for supervision of other normal pregnancy, third trimester: Secondary | ICD-10-CM

## 2022-04-24 NOTE — Progress Notes (Signed)
Routine Prenatal Care Visit  Subjective  Catherine Stewart is a 22 y.o. G2P0010 at 55w2dbeing seen today for ongoing prenatal care.  She is currently monitored for the following issues for this low-risk pregnancy and has Seasonal allergies and Encounter for supervision of normal first pregnancy in third trimester on their problem list.  ----------------------------------------------------------------------------------- Patient reports no complaints.  No regular contractions nor LOF Contractions: Irritability. Vag. Bleeding: None.  Movement: Present. Leaking Fluid denies.  ----------------------------------------------------------------------------------- The following portions of the patient's history were reviewed and updated as appropriate: allergies, current medications, past family history, past medical history, past social history, past surgical history and problem list. Problem list updated.  Objective  Blood pressure 111/72, pulse 88, weight 201 lb 14.4 oz (91.6 kg), last menstrual period 06/19/2021, unknown if currently breastfeeding. Pregravid weight 135 lb (61.2 kg) Total Weight Gain 66 lb 14.4 oz (30.3 kg) Urinalysis: Urine Protein    Urine Glucose    Fetal Status:     Movement: Present     General:  Alert, oriented and cooperative. Patient is in no acute distress.  Skin: Skin is warm and dry. No rash noted.   Cardiovascular: Normal heart rate noted  Respiratory: Normal respiratory effort, no problems with respiration noted  Abdomen: Soft, gravid, appropriate for gestational age. Pain/Pressure: Present     Pelvic:   Closed cervix/50%/-3 station. Not ripe,sl posterior         Extremities: Normal range of motion.  Edema: None  Mental Status: Normal mood and affect. Normal behavior. Normal judgment and thought content.   Assessment   22y.o. G2P0010 at 489w2dy  04/22/2022, by Ultrasound presenting for routine prenatal visit Needs plan for IOL at 41 weeks  Plan   second Problems  (from 09/04/21 to present)     No problems associated with this episode.        Term labor symptoms and general obstetric precautions including but not limited to vaginal bleeding, contractions, leaking of fluid and fetal movement were reviewed in detail with the patient. Please refer to After Visit Summary for other counseling recommendations.  Next date for IOL that is open: Tuesday, 3/12 at 0001 for likely cytotec and then foley or pitocin. She has been notified of the dates.   Return in about 3 days (around 04/27/2022) for return OB,NST.  MaImagene RichesCNM  04/24/2022 5:25 PM

## 2022-04-25 ENCOUNTER — Encounter: Payer: Self-pay | Admitting: Obstetrics

## 2022-04-27 ENCOUNTER — Other Ambulatory Visit: Payer: Self-pay | Admitting: Certified Nurse Midwife

## 2022-04-27 ENCOUNTER — Other Ambulatory Visit: Payer: Self-pay | Admitting: Obstetrics & Gynecology

## 2022-04-27 ENCOUNTER — Encounter: Payer: Self-pay | Admitting: Obstetrics and Gynecology

## 2022-04-27 ENCOUNTER — Ambulatory Visit
Admission: RE | Admit: 2022-04-27 | Discharge: 2022-04-27 | Disposition: A | Discharge status: Home / Self Care | Payer: Managed Care, Other (non HMO) | Source: Ambulatory Visit | Attending: Obstetrics & Gynecology | Admitting: Obstetrics & Gynecology

## 2022-04-27 ENCOUNTER — Ambulatory Visit (INDEPENDENT_AMBULATORY_CARE_PROVIDER_SITE_OTHER): Payer: Medicaid Other | Admitting: Obstetrics & Gynecology

## 2022-04-27 ENCOUNTER — Other Ambulatory Visit: Payer: Managed Care, Other (non HMO)

## 2022-04-27 ENCOUNTER — Ambulatory Visit (INDEPENDENT_AMBULATORY_CARE_PROVIDER_SITE_OTHER): Payer: Managed Care, Other (non HMO)

## 2022-04-27 ENCOUNTER — Observation Stay (HOSPITAL_BASED_OUTPATIENT_CLINIC_OR_DEPARTMENT_OTHER)
Admission: RE | Admit: 2022-04-27 | Discharge: 2022-04-27 | Disposition: A | Discharge status: Home / Self Care | Payer: Managed Care, Other (non HMO) | Source: Ambulatory Visit | Attending: Obstetrics and Gynecology | Admitting: Obstetrics and Gynecology

## 2022-04-27 ENCOUNTER — Other Ambulatory Visit: Payer: Self-pay

## 2022-04-27 VITALS — BP 123/85 | HR 102 | Wt 201.3 lb

## 2022-04-27 VITALS — BP 123/85 | HR 102 | Ht 63.0 in | Wt 201.3 lb

## 2022-04-27 DIAGNOSIS — Z3A40 40 weeks gestation of pregnancy: Secondary | ICD-10-CM | POA: Insufficient documentation

## 2022-04-27 DIAGNOSIS — O368330 Maternal care for abnormalities of the fetal heart rate or rhythm, third trimester, not applicable or unspecified: Secondary | ICD-10-CM

## 2022-04-27 DIAGNOSIS — Z79899 Other long term (current) drug therapy: Secondary | ICD-10-CM | POA: Insufficient documentation

## 2022-04-27 DIAGNOSIS — O288 Other abnormal findings on antenatal screening of mother: Secondary | ICD-10-CM

## 2022-04-27 DIAGNOSIS — O480 Post-term pregnancy: Secondary | ICD-10-CM

## 2022-04-27 NOTE — OB Triage Note (Signed)
Pt presents to L&D, was sent from the office for a non-reactive nst. Pt up from [place] where BPP just performed. EFM and toco applied and explained. Plan to monitor fetal well being and communicate findings with provider.

## 2022-04-27 NOTE — Progress Notes (Signed)
She is here for a NST due to postdates. I checked her cervix and found it to be closed/90%/-3/soft/posterior position. Her NST was non-reactive, no decels   She will get a BPP today and will base management on results.

## 2022-04-27 NOTE — Progress Notes (Signed)
    NURSE VISIT NOTE  Subjective:    Patient ID: Catherine Stewart, female    DOB: 01-13-2001, 22 y.o.   MRN: [id number]  HPI  Patient is a 22 y.o. G70P0010 female who presents for fetal monitoring per order from Gigi Gin, CNM.   Objective:    BP 123/85   Pulse (!) 102   Ht 5\' 3"  (1.6 m)   Wt 201 lb 4.8 oz (91.3 kg)   LMP 06/19/2021 (Approximate) Comment: patient states unsure of last LMP states her app keeps track and states her last LMP was May 2023 but patient believes she had a cycle in June 2023  BMI 35.66 kg/m  Estimated Date of Delivery: 04/22/22  [redacted]w[redacted]d  Fetus A Non-Stress Test Interpretation for 04/27/22  Indication: Post Dates  Fetal Heart Rate A Mode: External Baseline Rate (A): 150 bpm Variability: Moderate Accelerations: 10 x 10 Decelerations: None Multiple birth?: No  Uterine Activity Mode: Toco Contraction Frequency (min): None  Interpretation (Fetal Testing) Nonstress Test Interpretation: Non-reactive Overall Impression: Reassuring for gestational age   Assessment:   1. Post-term pregnancy, 40-42 weeks of gestation      Plan:   Results reviewed and discussed with patient by  Clovia Cuff, MD. Patient sent to [facility] for STAT BPP.    Otila Kluver, LPN

## 2022-04-27 NOTE — OB Triage Note (Signed)
   L&D OB Triage Note  SUBJECTIVE Catherine Stewart is a 22 y.o. G2P0010 female at [redacted]w[redacted]d, EDD Estimated Date of Delivery: 04/22/22 who presented to triage from the office for non reactive NST.   OB History  Gravida Para Term Preterm AB Living  2 0 0 0 1 0  SAB IAB Ectopic Multiple Live Births  0 0 0 0 0    # Outcome Date GA Lbr Len/2nd Weight Sex Delivery Anes PTL Lv  2 Current           1 AB             Medications Prior to Admission  Medication Sig Dispense Refill Last Dose   Prenatal Vit-Fe Fumarate-FA (PRENATAL VITAMINS) 28-0.8 MG TABS Take 1 tablet by mouth daily.   Past Week   cetirizine (ZYRTEC) 10 MG tablet Take 10 mg by mouth at bedtime. (Patient not taking: Reported on 04/16/2022)   Not Taking   fluticasone (FLONASE) 50 MCG/ACT nasal spray Place 1 spray into both nostrils daily. (Patient not taking: Reported on 04/16/2022)   Not Taking   montelukast (SINGULAIR) 10 MG tablet Take 10 mg by mouth at bedtime. (Patient not taking: Reported on 04/16/2022)   Not Taking     OBJECTIVE  Nursing Evaluation:   BP 113/76   Pulse 99   LMP 06/19/2021 (Approximate) Comment: patient states unsure of last LMP states her app keeps track and states her last LMP was May 2023 but patient believes she had a cycle in June 2023   Findings:   Reactive NST ,  BPP 8/8      NST was performed and has been reviewed by me.  NST INTERPRETATION: Category I  Mode: External Baseline Rate (A): 140 bpm Variability: Moderate Accelerations: 15 x 15 Decelerations: Variable     Contraction Frequency (min): occasional  ASSESSMENT Impression:  1.  Pregnancy:  G2P0010 at [redacted]w[redacted]d , EDD Estimated Date of Delivery: 04/22/22 2.  Reassuring fetal and maternal status 3.  Reactive NST  4.  BPP 8/8   PLAN 1. Current condition and above findings reviewed.  Reassuring fetal and maternal condition.  2. Discharge home with standard labor precautions given to return to L&D or call the office for problems. 3.Follow up  for induction 3/10 @0800  for induction.  4. Dr. Marcelline Mates consulted on plan of care and she is in agreement   Philip Aspen, CNM

## 2022-04-27 NOTE — Discharge Instructions (Signed)
Call provider or return to birthplace with: ° °1. Strong regular contractions every 5 minutes. °2. Leaking of fluid from your vagina °3. Vaginal bleeding: Bright red or heavy like a period °4. Decreased Fetal movement ° °

## 2022-04-27 NOTE — Patient Instructions (Signed)

## 2022-04-29 ENCOUNTER — Other Ambulatory Visit: Payer: Self-pay

## 2022-04-29 ENCOUNTER — Encounter: Payer: Self-pay | Admitting: Obstetrics and Gynecology

## 2022-04-29 ENCOUNTER — Inpatient Hospital Stay
Admission: RE | Admit: 2022-04-29 | Discharge: 2022-05-01 | DRG: 807 | Disposition: A | Discharge status: Home / Self Care | Payer: Managed Care, Other (non HMO) | Attending: Certified Nurse Midwife | Admitting: Certified Nurse Midwife

## 2022-04-29 DIAGNOSIS — F419 Anxiety disorder, unspecified: Secondary | ICD-10-CM | POA: Diagnosis present

## 2022-04-29 DIAGNOSIS — O480 Post-term pregnancy: Principal | ICD-10-CM | POA: Diagnosis present

## 2022-04-29 DIAGNOSIS — Z3A41 41 weeks gestation of pregnancy: Secondary | ICD-10-CM | POA: Diagnosis not present

## 2022-04-29 DIAGNOSIS — O99214 Obesity complicating childbirth: Secondary | ICD-10-CM | POA: Diagnosis present

## 2022-04-29 DIAGNOSIS — O99344 Other mental disorders complicating childbirth: Secondary | ICD-10-CM | POA: Diagnosis present

## 2022-04-29 LAB — CBC
HCT: 32.4 % — ABNORMAL LOW (ref 36.0–46.0)
Hemoglobin: 10.5 g/dL — ABNORMAL LOW (ref 12.0–15.0)
MCH: 27 pg (ref 26.0–34.0)
MCHC: 32.4 g/dL (ref 30.0–36.0)
MCV: 83.3 fL (ref 80.0–100.0)
Platelets: 247 10*3/uL (ref 150–400)
RBC: 3.89 MIL/uL (ref 3.87–5.11)
RDW: 13.7 % (ref 11.5–15.5)
WBC: 9.2 10*3/uL (ref 4.0–10.5)
nRBC: 0 % (ref 0.0–0.2)

## 2022-04-29 LAB — TYPE AND SCREEN
ABO/RH(D): A POS
Antibody Screen: NEGATIVE

## 2022-04-29 LAB — ABO/RH: ABO/RH(D): A POS

## 2022-04-29 MED ORDER — MISOPROSTOL 200 MCG PO TABS
ORAL_TABLET | ORAL | Status: AC
  Filled 2022-04-29: qty 4

## 2022-04-29 MED ORDER — ZOLPIDEM TARTRATE 5 MG PO TABS
5.0000 mg | ORAL_TABLET | Freq: Once | ORAL | Status: AC
  Administered 2022-04-29: 5 mg via ORAL
  Filled 2022-04-29: qty 1

## 2022-04-29 MED ORDER — LACTATED RINGERS IV SOLN
500.0000 mL | INTRAVENOUS | Status: DC | PRN
  Administered 2022-04-30: 500 mL via INTRAVENOUS

## 2022-04-29 MED ORDER — FENTANYL-BUPIVACAINE-NACL 0.5-0.125-0.9 MG/250ML-% EP SOLN
EPIDURAL | Status: AC
  Filled 2022-04-29: qty 250

## 2022-04-29 MED ORDER — AMMONIA AROMATIC IN INHA
RESPIRATORY_TRACT | Status: AC
  Filled 2022-04-29: qty 10

## 2022-04-29 MED ORDER — MISOPROSTOL 25 MCG QUARTER TABLET
ORAL_TABLET | ORAL | Status: AC
  Administered 2022-04-29: 25 ug via VAGINAL
  Filled 2022-04-29: qty 2

## 2022-04-29 MED ORDER — OXYTOCIN BOLUS FROM INFUSION
333.0000 mL | Freq: Once | INTRAVENOUS | Status: AC
  Administered 2022-04-30: 333 mL via INTRAVENOUS

## 2022-04-29 MED ORDER — MISOPROSTOL 50MCG HALF TABLET
50.0000 ug | ORAL_TABLET | Freq: Once | ORAL | Status: AC
  Administered 2022-04-29: 50 ug via VAGINAL
  Filled 2022-04-29: qty 1

## 2022-04-29 MED ORDER — OXYTOCIN-SODIUM CHLORIDE 30-0.9 UT/500ML-% IV SOLN
2.5000 [IU]/h | INTRAVENOUS | Status: DC
  Administered 2022-04-30: 2.5 [IU]/h via INTRAVENOUS
  Filled 2022-04-29: qty 500

## 2022-04-29 MED ORDER — TERBUTALINE SULFATE 1 MG/ML IJ SOLN: 0.2500 mg | Freq: Once | INTRAMUSCULAR | Status: DC | PRN

## 2022-04-29 MED ORDER — MISOPROSTOL 25 MCG QUARTER TABLET
25.0000 ug | ORAL_TABLET | Freq: Once | ORAL | Status: AC
  Administered 2022-04-29: 25 ug via ORAL

## 2022-04-29 MED ORDER — LACTATED RINGERS IV SOLN: INTRAVENOUS | Status: DC

## 2022-04-29 MED ORDER — MISOPROSTOL 50MCG HALF TABLET
50.0000 ug | ORAL_TABLET | Freq: Once | ORAL | Status: AC
  Administered 2022-04-29: 50 ug via ORAL
  Filled 2022-04-29: qty 1

## 2022-04-29 MED ORDER — MISOPROSTOL 25 MCG QUARTER TABLET
25.0000 ug | ORAL_TABLET | ORAL | Status: DC | PRN
  Filled 2022-04-29: qty 1

## 2022-04-29 MED ORDER — OXYTOCIN 10 UNIT/ML IJ SOLN
INTRAMUSCULAR | Status: AC
  Filled 2022-04-29: qty 2

## 2022-04-29 MED ORDER — LIDOCAINE HCL (PF) 1 % IJ SOLN
30.0000 mL | INTRAMUSCULAR | Status: DC | PRN
  Filled 2022-04-29: qty 30

## 2022-04-29 NOTE — Progress Notes (Signed)
Catherine Stewart is a 22 y.o. G2P0010 at 6w0dby ultrasound admitted for induction of labor due to Post dates. Due date 04/22/2022.  Subjective:She is feeling cramps with her contractions. Has rested, walked and sat on the labor ball.   Objective: BP 127/84 (BP Location: Right Arm)   Pulse 100   Temp 98.9 F (37.2 C) (Oral)   Resp 18   Ht '5\' 3"'$  (1.6 m)   Wt 91.3 kg   LMP 06/19/2021 (Approximate) Comment: patient states unsure of last LMP states her app keeps track and states her last LMP was May 2023 but patient believes she had a cycle in June 2023  BMI 35.66 kg/m  I/O last 3 completed shifts: In: 748.9 [I.V.:748.9] Out: -  No intake/output data recorded.  FHT:  FHR: 150 bpm, variability: moderate,  accelerations:  Present,  decelerations:  Absent UC:   regular, every 2-4 minutes, mild to moderate intensity SVE:   Dilation: 1.5 Effacement (%): 70 Station: -2 Exam by:: FRolly SalterCNM Bishop score:5 Labs: Lab Results  Component Value Date   WBC 9.2 04/29/2022   HGB 10.5 (L) 04/29/2022   HCT 32.4 (L) 04/29/2022   MCV 83.3 04/29/2022   PLT 247 04/29/2022    Assessment / Plan: Continuing her induction with cervical ripening. Will recheck in several hours and likely redose with cytotec. May place foley ball if cervix is reachable. Have discussed how the IOL may proceed with the patient and her husband.reviewed the use of Cytotec, foley ball and pitocin.   Fetal Wellbeing:  Category I Pain Control:  Labor support without medications I/D:  n/a Anticipated MOD:  NSVD Dr. CMarcelline Mateshas been updated on maternal/fetal status.  [name] CNM 04/29/2022, 9:39 PM

## 2022-04-29 NOTE — H&P (Signed)
Catherine Stewart is a 22 y.o. female presenting for induction of labor at [redacted] weeks gestation.. OB History     Gravida  2   Para  0   Term      Preterm      AB  1   Living         SAB      IAB      Ectopic      Multiple      Live Births             Past Medical History:  Diagnosis Date   Allergies    Allergy    Anxiety    Depression    Past Surgical History:  Procedure Laterality Date   INDUCED ABORTION  2019   TONSILECTOMY, ADENOIDECTOMY, BILATERAL MYRINGOTOMY AND TUBES     TONSILLECTOMY     TYMPANOSTOMY TUBE PLACEMENT     WISDOM TOOTH EXTRACTION     all four; age 59   Family History: family history includes Bipolar disorder in her mother; COPD in her paternal grandmother; Cancer (age of onset: 42) in her half-brother; Cirrhosis in her father; Healthy in her mother and paternal grandfather. Social History:  reports that she has never smoked. She has never used smokeless tobacco. She reports that she does not currently use alcohol. She reports that she does not use drugs.     Maternal Diabetes: No Genetic Screening: Normal Maternal Ultrasounds/Referrals: Normal Fetal Ultrasounds or other Referrals:  None Maternal Substance Abuse:  No Significant Maternal Medications:  None Significant Maternal Lab Results:  Group B Strep negative Number of Prenatal Visits:greater than 3 verified prenatal visits Other Comments:  None  Review of Systems  Constitutional: Negative.   HENT: Negative.    Eyes: Negative.   Respiratory: Negative.    Cardiovascular: Negative.   Gastrointestinal:        Gravid, no regular contractions  Endocrine: Negative.   Genitourinary: Negative.   Allergic/Immunologic: Negative.   Neurological: Negative.   Hematological: Negative.   Psychiatric/Behavioral: Negative.     History Dilation: Closed Effacement (%): 50 Station: -2 Exam by:: M.Newton, RN Blood pressure 135/77, pulse (!) 109, temperature 98 F (36.7 C), temperature  source Oral, resp. rate 17, height '5\' 3"'$  (1.6 m), weight 91.3 kg, last menstrual period 06/19/2021, unknown if currently breastfeeding. Maternal Exam:  Uterine Assessment: Contraction strength is mild.  Abdomen: Fetal presentation: vertex Introitus: Normal vulva. Normal vagina.  Pelvis: adequate for delivery.   Cervix: Cervix evaluated by digital exam.     Physical Exam Constitutional:      Appearance: Normal appearance. She is obese.  Cardiovascular:     Rate and Rhythm: Normal rate and regular rhythm.     Pulses: Normal pulses.     Heart sounds: Normal heart sounds.  Pulmonary:     Effort: Pulmonary effort is normal.     Breath sounds: Normal breath sounds.  Abdominal:     Comments: gravid  Genitourinary:    General: Normal vulva.     Rectum: Normal.  Musculoskeletal:        General: Normal range of motion.     Cervical back: Normal range of motion and neck supple.  Skin:    General: Skin is warm and dry.  Neurological:     General: No focal deficit present.     Mental Status: She is alert and oriented to person, place, and time.  Psychiatric:        Mood and Affect:  Mood normal.        Behavior: Behavior normal.   SVE (per RN)- closed/50%/ -2 Prenatal labs: ABO, Rh: --/--/A POS Performed at [facility], [address] [place], [place] [address]  [phone]) Antibody: NEG (03/10 0913) Rubella: 8.00 (07/21 1628) RPR: Non Reactive (12/14 [id number])  HBsAg: Negative (07/21 1628)  HIV: Non Reactive (12/14 [id number])  GBS: Negative/-- (02/05 1641)   Assessment/Plan: 22 yo Gravida 2 Para 0 at [redacted] weeks gestation for induction of labor Category 1 FHTS Bishop score:3  Plan: routine admission orders, including IV of LR CBC, Type and screen, RPR, \ GBS is negative EFM continuous Will start with cervical ripening, using Cytotec 50 mcg vaginally and 50 mcg orally Recheck 4 hours post first dosing. The process of induction has been carefully explained to the patient  and her husband. All of their questions have been answered.    Imagene Riches 04/29/2022, 11:22 AM

## 2022-04-29 NOTE — Progress Notes (Signed)
Catherine Stewart is a 22 y.o. G2P0010 at 27w0dby LMP admitted for induction of labor due to Post dates. Due date 04/22/2022.  Subjective: Catherine Stewart is aware of her contractions , but is able to talk through them. They are only mildly uncomfortable. Her husband is at the bedside and is very supportive.   Objective: BP 126/71 (BP Location: Right Arm)   Pulse 88   Temp 98.4 F (36.9 C) (Oral)   Resp 18   Ht '5\' 3"'$  (1.6 m)   Wt 91.3 kg   LMP 06/19/2021 (Approximate) Comment: patient states unsure of last LMP states her app keeps track and states her last LMP was May 2023 but patient believes she had a cycle in June 2023  BMI 35.66 kg/m  No intake/output data recorded. Total I/O In: 748.9 [I.V.:748.9] Out: -  Vaginal exam: FT/60%/-2. Cervix is posterior, firm Bishop score is 4 FHT:  FHR: 140 bpm, variability: moderate,  accelerations:  Present,  decelerations:  Absent UC:   regular, every 4 minutes SVE:   Dilation: ft Effacement (%): 60 Station: -2 Exam by:: MGigi GinCNM  Labs: Lab Results  Component Value Date   WBC 9.2 04/29/2022   HGB 10.5 (L) 04/29/2022   HCT 32.4 (L) 04/29/2022   MCV 83.3 04/29/2022   PLT 247 04/29/2022    Assessment / Plan: Induction of labor due to postterm Category1 1 FHTs  Labor:  Continues with cervical ripening. Cytotec 226m placed vaginally and 25 mcg oral dosing  Fetal Wellbeing:  Category I Pain Control:  Labor support without medications I/D:  n/a Anticipated MOD:  NSVD  MaImagene RichesCNM 04/29/2022, 3:19 PM

## 2022-04-30 ENCOUNTER — Inpatient Hospital Stay: Payer: Managed Care, Other (non HMO) | Admitting: Anesthesiology

## 2022-04-30 ENCOUNTER — Encounter: Payer: Self-pay | Admitting: Obstetrics

## 2022-04-30 DIAGNOSIS — O480 Post-term pregnancy: Principal | ICD-10-CM

## 2022-04-30 DIAGNOSIS — Z3A41 41 weeks gestation of pregnancy: Secondary | ICD-10-CM

## 2022-04-30 LAB — RPR: RPR Ser Ql: NONREACTIVE

## 2022-04-30 MED ORDER — ONDANSETRON HCL 4 MG/2ML IJ SOLN
INTRAMUSCULAR | Status: AC
  Administered 2022-04-30: 4 mg via INTRAVENOUS
  Filled 2022-04-30: qty 2

## 2022-04-30 MED ORDER — OXYCODONE-ACETAMINOPHEN 5-325 MG PO TABS: 2.0000 | ORAL_TABLET | ORAL | Status: DC | PRN

## 2022-04-30 MED ORDER — METHYLERGONOVINE MALEATE 0.2 MG PO TABS: 0.2000 mg | ORAL_TABLET | ORAL | Status: DC | PRN

## 2022-04-30 MED ORDER — DIBUCAINE (PERIANAL) 1 % EX OINT
1.0000 | TOPICAL_OINTMENT | CUTANEOUS | Status: DC | PRN
  Filled 2022-04-30: qty 28

## 2022-04-30 MED ORDER — ACETAMINOPHEN 500 MG PO TABS
1000.0000 mg | ORAL_TABLET | Freq: Once | ORAL | Status: AC
  Administered 2022-04-30: 1000 mg via ORAL

## 2022-04-30 MED ORDER — METHYLERGONOVINE MALEATE 0.2 MG/ML IJ SOLN: 0.2000 mg | INTRAMUSCULAR | Status: DC | PRN

## 2022-04-30 MED ORDER — DIPHENHYDRAMINE HCL 50 MG/ML IJ SOLN: 12.5000 mg | INTRAMUSCULAR | Status: DC | PRN

## 2022-04-30 MED ORDER — FENTANYL-BUPIVACAINE-NACL 0.5-0.125-0.9 MG/250ML-% EP SOLN
EPIDURAL | Status: AC
  Filled 2022-04-30: qty 250

## 2022-04-30 MED ORDER — SENNOSIDES-DOCUSATE SODIUM 8.6-50 MG PO TABS: 2.0000 | ORAL_TABLET | ORAL | Status: DC

## 2022-04-30 MED ORDER — FENTANYL-BUPIVACAINE-NACL 0.5-0.125-0.9 MG/250ML-% EP SOLN
12.0000 mL/h | EPIDURAL | Status: DC | PRN
  Administered 2022-04-30: 12 mL/h via EPIDURAL

## 2022-04-30 MED ORDER — DOCUSATE SODIUM 100 MG PO CAPS
100.0000 mg | ORAL_CAPSULE | Freq: Two times a day (BID) | ORAL | Status: DC
  Filled 2022-04-30: qty 1

## 2022-04-30 MED ORDER — MISOPROSTOL 25 MCG QUARTER TABLET: 50.0000 ug | ORAL_TABLET | ORAL | Status: DC | PRN

## 2022-04-30 MED ORDER — PRENATAL MULTIVITAMIN CH
1.0000 | ORAL_TABLET | Freq: Every day | ORAL | Status: DC
  Administered 2022-05-01: 1 via ORAL
  Filled 2022-04-30: qty 1

## 2022-04-30 MED ORDER — LACTATED RINGERS IV SOLN: INTRAVENOUS | Status: DC | PRN

## 2022-04-30 MED ORDER — SIMETHICONE 80 MG PO CHEW: 80.0000 mg | CHEWABLE_TABLET | ORAL | Status: DC | PRN

## 2022-04-30 MED ORDER — ONDANSETRON HCL 4 MG/2ML IJ SOLN: 4.0000 mg | INTRAMUSCULAR | Status: DC | PRN

## 2022-04-30 MED ORDER — IBUPROFEN 600 MG PO TABS
600.0000 mg | ORAL_TABLET | Freq: Four times a day (QID) | ORAL | Status: DC
  Administered 2022-05-01 (×3): 600 mg via ORAL
  Filled 2022-04-30 (×3): qty 1

## 2022-04-30 MED ORDER — OXYCODONE-ACETAMINOPHEN 5-325 MG PO TABS: 1.0000 | ORAL_TABLET | ORAL | Status: DC | PRN

## 2022-04-30 MED ORDER — EPHEDRINE 5 MG/ML INJ
10.0000 mg | INTRAVENOUS | Status: DC | PRN
  Filled 2022-04-30: qty 5

## 2022-04-30 MED ORDER — BUPIVACAINE HCL (PF) 0.25 % IJ SOLN
INTRAMUSCULAR | Status: DC | PRN
  Administered 2022-04-30 (×2): 4 mL via EPIDURAL

## 2022-04-30 MED ORDER — ONDANSETRON HCL 4 MG PO TABS: 4.0000 mg | ORAL_TABLET | ORAL | Status: DC | PRN

## 2022-04-30 MED ORDER — MISOPROSTOL 50MCG HALF TABLET
50.0000 ug | ORAL_TABLET | Freq: Once | ORAL | Status: AC
  Administered 2022-04-30: 50 ug via ORAL
  Filled 2022-04-30: qty 1

## 2022-04-30 MED ORDER — OXYTOCIN-SODIUM CHLORIDE 30-0.9 UT/500ML-% IV SOLN
1.0000 m[IU]/min | INTRAVENOUS | Status: DC
  Administered 2022-04-30: 8 m[IU]/min via INTRAVENOUS
  Administered 2022-04-30: 4 m[IU]/min via INTRAVENOUS

## 2022-04-30 MED ORDER — NALBUPHINE HCL 10 MG/ML IJ SOLN
10.0000 mg | INTRAMUSCULAR | Status: DC | PRN
  Administered 2022-04-30: 10 mg via INTRAVENOUS
  Filled 2022-04-30: qty 1

## 2022-04-30 MED ORDER — LACTATED RINGERS IV SOLN
500.0000 mL | Freq: Once | INTRAVENOUS | Status: AC
  Administered 2022-04-30: 500 mL via INTRAVENOUS

## 2022-04-30 MED ORDER — BENZOCAINE-MENTHOL 20-0.5 % EX AERO
1.0000 | INHALATION_SPRAY | CUTANEOUS | Status: DC | PRN
  Filled 2022-04-30 (×2): qty 56

## 2022-04-30 MED ORDER — LIDOCAINE HCL (PF) 1 % IJ SOLN
INTRAMUSCULAR | Status: DC | PRN
  Administered 2022-04-30: 3 mL

## 2022-04-30 MED ORDER — FENTANYL CITRATE (PF) 100 MCG/2ML IJ SOLN
50.0000 ug | INTRAMUSCULAR | Status: DC | PRN
  Administered 2022-04-30: 50 ug via INTRAVENOUS
  Administered 2022-04-30: 100 ug via INTRAVENOUS
  Filled 2022-04-30 (×2): qty 2

## 2022-04-30 MED ORDER — FAMOTIDINE IN NACL 20-0.9 MG/50ML-% IV SOLN
20.0000 mg | Freq: Two times a day (BID) | INTRAVENOUS | Status: DC | PRN
  Administered 2022-04-30: 20 mg via INTRAVENOUS
  Filled 2022-04-30: qty 50

## 2022-04-30 MED ORDER — WITCH HAZEL-GLYCERIN EX PADS: 1.0000 | MEDICATED_PAD | CUTANEOUS | Status: DC | PRN

## 2022-04-30 MED ORDER — ONDANSETRON HCL 4 MG/2ML IJ SOLN: 4.0000 mg | Freq: Once | INTRAMUSCULAR | Status: AC

## 2022-04-30 MED ORDER — PHENYLEPHRINE 80 MCG/ML (10ML) SYRINGE FOR IV PUSH (FOR BLOOD PRESSURE SUPPORT): 80.0000 ug | PREFILLED_SYRINGE | INTRAVENOUS | Status: DC | PRN

## 2022-04-30 MED ORDER — COCONUT OIL OIL
1.0000 | TOPICAL_OIL | Status: DC | PRN
  Filled 2022-04-30: qty 7.5

## 2022-04-30 MED ORDER — TERBUTALINE SULFATE 1 MG/ML IJ SOLN: 0.2500 mg | Freq: Once | INTRAMUSCULAR | Status: DC | PRN

## 2022-04-30 MED ORDER — FERROUS SULFATE 325 (65 FE) MG PO TABS
325.0000 mg | ORAL_TABLET | Freq: Every day | ORAL | Status: DC
  Administered 2022-05-01: 325 mg via ORAL
  Filled 2022-04-30: qty 1

## 2022-04-30 MED ORDER — LIDOCAINE-EPINEPHRINE (PF) 1.5 %-1:200000 IJ SOLN
INTRAMUSCULAR | Status: DC | PRN
  Administered 2022-04-30: 3 mL via PERINEURAL

## 2022-04-30 MED ORDER — ACETAMINOPHEN 500 MG PO TABS
ORAL_TABLET | ORAL | Status: AC
  Administered 2022-05-01: 650 mg via ORAL
  Filled 2022-04-30: qty 2

## 2022-04-30 NOTE — Progress Notes (Signed)
Catherine Stewart is a 22 y.o. G2P0010 at 54w1dwho continues with induction of labor. She was treated with several doses of Cytotec yesterday and in the evening. Dr. CMarcelline Mateswas in to see this patient early in the AM and [facility].   Subjective: The patient recently asked for another dose of IV pain medication, and after having some of the fluid in the CNetcongcatheter withdrawn, the patient reported a gush of fluid. Some light meconium is seen.   Objective: BP (!) 125/50   Pulse 88   Temp 98.5 F (36.9 C) (Oral)   Resp 18   Ht '5\' 3"'$  (1.6 m)   Wt 91.3 kg   LMP 06/19/2021 (Approximate) Comment: patient states unsure of last LMP states her app keeps track and states her last LMP was May 2023 but patient believes she had a cycle in June 2023  BMI 35.66 kg/m  I/O last 3 completed shifts: In: 748.9 [I.V.:748.9] Out: -  No intake/output data recorded.  FHT:  FHR: 140 bpm, variability: minimal to moderate with some periods of minimal seen over the last few hours. accelerations:  Abscent,  decelerations:  Absent UC:   regular, every 2-2.5 minutes SVE:   Dilation: 5 Effacement (%): 80 Station: -2 Exam by:: FRolly Salter CNM  Labs: Lab Results  Component Value Date   WBC 9.2 04/29/2022   HGB 10.5 (L) 04/29/2022   HCT 32.4 (L) 04/29/2022   MCV 83.3 04/29/2022   PLT 247 04/29/2022    Assessment / Plan: Induction of labor due to postterm,  progressing well on pitocin  Labor:  Has made significant cervical change since [facility]  and removal  Fetal Wellbeing:  Category II Pain Control:  IV pain meds, now requesting an epidural. I/D:  n/a Anticipated MOD:  NSVD  Stewart Catherine CNM 04/30/2022, 6:57 AM

## 2022-04-30 NOTE — Anesthesia Procedure Notes (Addendum)
Epidural Patient location during procedure: OB Start time: 04/30/2022 7:25 AM End time: 04/30/2022 7:37 AM  Staffing Anesthesiologist: Darrin Nipper, MD Resident/CRNA: Norm Salt, CRNA Performed: resident/CRNA   Preanesthetic Checklist Completed: patient identified, IV checked, site marked, risks and benefits discussed, surgical consent, monitors and equipment checked, pre-op evaluation and timeout performed  Epidural Patient position: sitting Prep: ChloraPrep Patient monitoring: heart rate, continuous pulse ox and blood pressure Approach: midline Location: L3-L4 Injection technique: LOR saline  Needle:  Needle type: Tuohy  Needle gauge: 17 G Needle length: 9 cm and 9 Needle insertion depth: 9 cm Catheter type: closed end flexible Catheter size: 19 Gauge Catheter at skin depth: 14 cm Test dose: negative and 1.5% lidocaine with Epi 1:200 K  Assessment Sensory level: T10 Events: blood not aspirated, no cerebrospinal fluid, injection not painful, no injection resistance, no paresthesia and negative IV test  Additional Notes 1 attempt Pt. Evaluated and documentation done after procedure finished. Patient identified. Risks/Benefits/Options discussed with patient including but not limited to bleeding, infection, nerve damage, paralysis, failed block, incomplete pain control, headache, blood pressure changes, nausea, vomiting, reactions to medication both or allergic, itching and postpartum back pain. Confirmed with bedside nurse the patient's most recent platelet count. Confirmed with patient that they are not currently taking any anticoagulation, have any bleeding history or any family history of bleeding disorders. Patient expressed understanding and wished to proceed. All questions were answered. Sterile technique was used throughout the entire procedure. Please see nursing notes for vital signs. Test dose was given through epidural catheter and negative prior to continuing to  dose epidural or start infusion. Warning signs of high block given to the patient including shortness of breath, tingling/numbness in hands, complete motor block, or any concerning symptoms with instructions to call for help. Patient was given instructions on fall risk and not to get out of bed. All questions and concerns addressed with instructions to call with any issues or inadequate analgesia.    Patient tolerated the insertion well without immediate complications.Reason for block:procedure for pain

## 2022-04-30 NOTE — Progress Notes (Signed)
LABOR NOTE   Catherine Stewart 22 y.o.GP@ at [redacted]w[redacted]d SUBJECTIVE:  Resting comfortably  Analgesia: Epidural  OBJECTIVE:  BP 128/65   Pulse (!) 121   Temp 98.3 F (36.8 C) (Oral)   Resp 17   Ht '5\' 3"'$  (1.6 m)   Wt 91.3 kg   LMP 06/19/2021 (Approximate) Comment: patient states unsure of last LMP states her app keeps track and states her last LMP was May 2023 but patient believes she had a cycle in June 2023  SpO2 98%   BMI 35.66 kg/m  Total I/O In: 4350.6 [I.V.:4260.9; Other:49.6; IV Piggyback:40] Out: -   She has shown cervical change. CERVIX: 9 cm:  100%:   -2:   mid position:   soft SVE:   Dilation: 9 Effacement (%): 100 Station: -2 Exam by:: TGrandville Silos CNM CONTRACTIONS: regular, every 2-3 minutes FHR: Fetal heart tracing reviewed. Baseline: 145 bpm, Variability: Good {> 6 bpm), Accelerations: Reactive, and Decelerations: Late Category II  PT repositioned , lates resolved with position change   Labs: Lab Results  Component Value Date   WBC 9.2 04/29/2022   HGB 10.5 (L) 04/29/2022   HCT 32.4 (L) 04/29/2022   MCV 83.3 04/29/2022   PLT 247 04/29/2022    ASSESSMENT: 1) Labor curve reviewed.       Progress: Active phase labor.     Membranes: ruptured, meconium light, bloody show     IUPC in place       Principal Problem:   Labor and delivery, indication for care   PLAN: continue present management   [name] CNM  04/30/2022 12:33 PM

## 2022-04-30 NOTE — Lactation Note (Signed)
This note was copied from a baby's chart. Lactation Consultation Note  Patient Name: Catherine Stewart [id number] Date: 04/30/2022 Age:21 hours Reason for consult: L&D Initial assessment;Primapara;Term;RN request   Maternal Data This is mom's first baby, SVD. Mom with history of anxiety and depression. Baby LGA.  Requested by L&D care nurse to assist mom with breastfeeding. Baby with difficulty sustaining latch.   Does the patient have breastfeeding experience prior to this delivery?: No  Feeding Mother's Current Feeding Choice: Breast Milk Provided mom with tips and strategies to maximize position and latch technique. Baby did latch deeply and sustained latch with mother supporting breast with "sandwich " technique. Audible swallows noted.  LATCH Score Latch: Repeated attempts needed to sustain latch, nipple held in mouth throughout feeding, stimulation needed to elicit sucking reflex.  Audible Swallowing: Spontaneous and intermittent  Type of Nipple: Everted at rest and after stimulation  Comfort (Breast/Nipple): Soft / non-tender  Hold (Positioning): Assistance needed to correctly position infant at breast and maintain latch.  LATCH Score: 8   Interventions Interventions: Breast feeding basics reviewed;Assisted with latch;Breast massage;Breast compression;Adjust position;Support pillows;Education   Consult Status Consult Status: Follow-up from L&D  Care nurse in attendance at bedside,. She was updated baby's feed was effective.  Catherine Stewart 04/30/2022, 5:34 PM

## 2022-04-30 NOTE — Progress Notes (Signed)
LABOR NOTE   Catherine Stewart 22 y.o.GP@ at [redacted]w[redacted]d SUBJECTIVE:  Feeling discomfort on her right side after epidural placement. States that her lefts side is numb but her right side feels like "double contractions".  Analgesia: Epidural  OBJECTIVE:  BP 112/64   Pulse 91   Temp 98.6 F (37 C) (Oral)   Resp 19   Ht '5\' 3"'$  (1.6 m)   Wt 91.3 kg   LMP 06/19/2021 (Approximate) Comment: patient states unsure of last LMP states her app keeps track and states her last LMP was May 2023 but patient believes she had a cycle in June 2023  SpO2 99%   BMI 35.66 kg/m  Total I/O In: 3297.7 [I.V.:3251.1; Other:6.6; IV Piggyback:40] Out: -   She has not shown cervical change. CERVIX: 5 cm:  80%:   -2:   mid position:   firm SVE:   Dilation: 5 Effacement (%): 80 Station: -2 Exam by:: FRolly Salter CNM CONTRACTIONS: regular, every 2-3 minutes FHR: Fetal heart tracing reviewed. Baseline: 145 bpm, Variability: Fair (1-6 bpm), Accelerations: Reactive, and Decelerations: variable  Category II  Positive 15 x 15 accelerations with scalp stimulation during vaginal exam  Labs: Lab Results  Component Value Date   WBC 9.2 04/29/2022   HGB 10.5 (L) 04/29/2022   HCT 32.4 (L) 04/29/2022   MCV 83.3 04/29/2022   PLT 247 04/29/2022    ASSESSMENT: 1) Labor curve reviewed.       Progress: Prolonged latent labor.     Membranes: ruptured, meconium light     IUPC placed        Principal Problem:   Labor and delivery, indication for care   PLAN: Discussed plan of care with pt and her partner. Recommended IUPC placement if no cervical change to better evaluate contraction strength. Discussed risk and benefits for use of IUPC. Pt and her partner are in agreement with plan. Discussed use of pitocin for inadequate contractions. Dr. EEllard Artisnotified and update with strip , progress of labor , and plan of care. He is in agreement.    [name] CNM  04/30/2022 8:48 AM

## 2022-04-30 NOTE — Anesthesia Preprocedure Evaluation (Signed)
Anesthesia Evaluation  Patient identified by MRN, date of birth, ID band Patient awake    Reviewed: Allergy & Precautions, H&P , NPO status , Patient's Chart, lab work & pertinent test results  Airway Mallampati: II       Dental no notable dental hx.    Pulmonary neg pulmonary ROS          Cardiovascular negative cardio ROS      Neuro/Psych   Anxiety Depression    negative neurological ROS     GI/Hepatic Neg liver ROS,GERD  ,,  Endo/Other  negative endocrine ROS    Renal/GU negative Renal ROS  negative genitourinary   Musculoskeletal   Abdominal   Peds  Hematology negative hematology ROS (+)   Anesthesia Other Findings   Reproductive/Obstetrics (+) Pregnancy                             Anesthesia Physical Anesthesia Plan  ASA: 2  Anesthesia Plan: Epidural   Post-op Pain Management:    Induction:   PONV Risk Score and Plan:   Airway Management Planned:   Additional Equipment:   Intra-op Plan:   Post-operative Plan:   Informed Consent: I have reviewed the patients History and Physical, chart, labs and discussed the procedure including the risks, benefits and alternatives for the proposed anesthesia with the patient or authorized representative who has indicated his/her understanding and acceptance.     Dental Advisory Given  Plan Discussed with: CRNA and Anesthesiologist  Anesthesia Plan Comments:        Anesthesia Quick Evaluation

## 2022-04-30 NOTE — Discharge Summary (Signed)
Postpartum Discharge Summary  Date of Service updated: 05/01/2022     Patient Name: Catherine Stewart DOB: [birth year] MRN: [id number]  Date of admission: 04/29/2022 Delivery date: 04/30/2022  Delivering provider: Philip Aspen  Date of discharge: 05/01/2022  Admitting diagnosis: Labor and delivery, indication for care [O75.9] Intrauterine pregnancy: [redacted]w[redacted]d    Secondary diagnosis:  Principal Problem:   Labor and delivery, indication for care Active Problems:   Postpartum care following vaginal delivery   Encounter for care or examination of lactating mother  Additional problems: anxiety, depression    Discharge diagnosis: Term Pregnancy Delivered                                              Post partum procedures: none Augmentation: Pitocin Complications: None  Hospital course: Induction of Labor With Vaginal Delivery   22y.o. yo G2P0010 at 21w1das admitted to the hospital 04/29/2022 for induction of labor.  Indication for induction: Postdates.  Patient had an labor course complicated by light meconium amniotic fluid.  Membrane Rupture Time/Date: 6:33 AM ,04/30/2022   Delivery Method:Vaginal, Spontaneous  Episiotomy: None  Lacerations:   Bilateral labial abrasions, hemostatic no repair needed Details of delivery can be found in separate delivery note.    Patient had a postpartum course complicated by NA. She is tolerating regular diet, her pain is controlled with PO medication, she is ambulating and voiding without difficulty. She reports breastfeeding is going well and she will appreciate visit with LCGilbertsvillerior to discharge.   Patient is discharged home 05/01/22.  Newborn Data: Birth date:04/30/2022  Birth time:2:35 PM  Gender:Female  Living status:Living  Apgars:2 ,9  Weight:4040 g   Magnesium Sulfate received: No BMZ received: No Rhophylac:No MMR:No T-DaP:Given prenatally Flu: No Transfusion:No  Physical exam  Vitals:   04/30/22 2356 05/01/22 0200 05/01/22 0401  05/01/22 0757  BP: 102/62  110/70 125/74  Pulse: (!) 105  95 85  Resp: '20  19 18  '$ Temp: 100.1 F (37.8 C) 99.6 F (37.6 C) 98.9 F (37.2 C) 97.7 F (36.5 C)  TempSrc: Oral Oral Oral Oral  SpO2: 98%  100% 100%  Weight:      Height:       General: alert, cooperative, and no distress Lochia: appropriate Uterine Fundus: firm Incision: N/A DVT Evaluation: No evidence of DVT seen on physical exam. Labs: Lab Results  Component Value Date   WBC 15.5 (H) 05/01/2022   HGB 8.9 (L) 05/01/2022   HCT 28.0 (L) 05/01/2022   MCV 85.6 05/01/2022   PLT 189 05/01/2022      Latest Ref Rng & Units 10/09/2019    9:29 PM  CMP  Glucose 70 - 99 mg/dL 112   BUN 6 - 20 mg/dL 6   Creatinine 0.44 - 1.00 mg/dL 0.94   Sodium 135 - 145 mmol/L 135   Potassium 3.5 - 5.1 mmol/L 4.2   Chloride 98 - 111 mmol/L 100   CO2 22 - 32 mmol/L 25   Calcium 8.9 - 10.3 mg/dL 9.4   Total Protein 6.5 - 8.1 g/dL 8.2   Total Bilirubin 0.3 - 1.2 mg/dL 1.7   Alkaline Phos 38 - 126 U/L 55   AST 15 - 41 U/L 14   ALT 0 - 44 U/L 13    Edinburgh Score:    09/04/2021  3:36 PM  Edinburgh Postnatal Depression Scale Screening Tool  I have been able to laugh and see the funny side of things. 0  I have looked forward with enjoyment to things. 0  I have blamed myself unnecessarily when things went wrong. 0  I have been anxious or worried for no good reason. 0  I have felt scared or panicky for no good reason. 0  Things have been getting on top of me. 0  I have been so unhappy that I have had difficulty sleeping. 0  I have felt sad or miserable. 0  I have been so unhappy that I have been crying. 0  The thought of harming myself has occurred to me. 0  Edinburgh Postnatal Depression Scale Total 0      After visit meds:  Allergies as of 05/01/2022       Reactions   Doxycycline Nausea And Vomiting   Penicillins Hives   Amoxicillin-pot Clavulanate Rash        Medication List     STOP taking these medications     cetirizine 10 MG tablet Commonly known as: ZYRTEC   fluticasone 50 MCG/ACT nasal spray Commonly known as: FLONASE   montelukast 10 MG tablet Commonly known as: SINGULAIR       TAKE these medications    Prenatal Vitamins 28-0.8 MG Tabs Take 1 tablet by mouth daily.         Discharge home in stable condition Infant Feeding: Breast Infant Disposition:home with mother Discharge instruction: per After Visit Summary and Postpartum booklet. Activity: Advance as tolerated. Pelvic rest for 6 weeks.  Diet: routine diet Anticipated Birth Control: IUD Postpartum Appointment:6 weeks and prn Additional Postpartum F/U:  none Future Appointments:No future appointments.   Follow up Visit:  Follow-up Information     Philip Aspen, CNM. Schedule an appointment as soon as possible for a visit in 6 week(s).   Specialties: Certified Nurse Midwife, Radiology Why: postpartum follow up and earlier if needed Contact information: [address] [place] [address] [phone]                  05/01/2022 Rod Can, CNM

## 2022-04-30 NOTE — Progress Notes (Signed)
Intrapartum Progress Note  S: Patient lying in bed, occasionally feeling crampy.   O: Blood pressure 127/84, pulse 100, temperature 98.5 F (36.9 C), temperature source Oral, resp. rate 18, height '5\' 3"'$  (1.6 m), weight 91.3 kg, last menstrual period 06/19/2021, unknown if currently breastfeeding. Gen App: NAD, comfortable Abdomen: soft, gravid FHT: baseline 140 bpm.  Accels present.  Decels present - occasional shallow variable . moderate in degree variability.   Tocometer: contractions irregular  q 3-5 minutes, mild Cervix: 1.5/50/ballotable Extremities: Nontender, no edema.  Pitocin: None  Labs: No new labs   Assessment:  1: SIUP at [redacted]w[redacted]d  Plan:  1. Continue postdates induction. Cook's catheter placed.   2. IV pain medication as desired. Epidural when desired.  3. Cytotec prn for induction.    CRubie Maid MD 04/30/2022 2:22 AM

## 2022-05-01 ENCOUNTER — Encounter: Payer: Self-pay | Admitting: Obstetrics & Gynecology

## 2022-05-01 LAB — CBC
HCT: 28 % — ABNORMAL LOW (ref 36.0–46.0)
Hemoglobin: 8.9 g/dL — ABNORMAL LOW (ref 12.0–15.0)
MCH: 27.2 pg (ref 26.0–34.0)
MCHC: 31.8 g/dL (ref 30.0–36.0)
MCV: 85.6 fL (ref 80.0–100.0)
Platelets: 189 10*3/uL (ref 150–400)
RBC: 3.27 MIL/uL — ABNORMAL LOW (ref 3.87–5.11)
RDW: 14.1 % (ref 11.5–15.5)
WBC: 15.5 10*3/uL — ABNORMAL HIGH (ref 4.0–10.5)
nRBC: 0 % (ref 0.0–0.2)

## 2022-05-01 MED ORDER — ACETAMINOPHEN 325 MG PO TABS
650.0000 mg | ORAL_TABLET | Freq: Four times a day (QID) | ORAL | Status: DC | PRN
  Administered 2022-05-01: 650 mg via ORAL
  Filled 2022-05-01 (×2): qty 2

## 2022-05-01 NOTE — Progress Notes (Signed)
Patient discharged home with family.  Discharge instructions, when to follow up, and medications reviewed with patient.  Patient verbalized understanding. Patient will be escorted out by auxiliary.   

## 2022-05-01 NOTE — Lactation Note (Signed)
This note was copied from a baby's chart. Lactation Consultation Note  Patient Name: Catherine Stewart [id number] Date: 05/01/2022 Age:21 hours    LC in room to meet with parents. Mom is P1 with noted hx of anxiety and depression. Parents note initial feeding went well but then baby started to have difficulties with latching and staying at the breast. Care RN overnight provided BF support and a nipple shield. Pump was set-up but has not been used. Mom reports about 5 feedings with the nipple shield, feels comfortable with placing it and latching baby in football hold- would like help/support with other positions if time allows. We discussed the barrier that the nipple shield can be to optimal milk removal and stimulation of the hormone receptors are blocked by the nipple shield. We discussed the need for mom to initiate pumping post feedings to help encourage the onset of an adequate milk production and protection of the milk supply. Baby sleeping at this time so only education provided during this visit. LC to return when baby is cuing and ready to eat to support parents.  Lavonia Drafts 05/01/2022, 10:28 AM

## 2022-05-01 NOTE — Discharge Instructions (Addendum)
Discharge instructions:   Call office if you have any of the following:  headache, visual changes, fever >101.0 F, chills, breast concerns (engorgement, mastitis) excessive vaginal bleeding, incision drainage or problems, leg pain or redness, depression or any other concerns.    No intercourse or tampons for 6 weeks.  No strenuous activity or heavy lifting for 6 weeks.  No swimming pools or, hot tubs for 6 weeks  It is normal to bleed for up to 6 weeks. You should not soak through more than 1 pad in 1 hour.   Continue prenatal vitamin. Increase calories and fluids while breastfeeding.  Your milk will come in, in the next couple of days (right now it is colostrum).  You may have a slight fever when your milk comes in, but it should go away on its own.   If it does not, and rises above 101 F please call the doctor.  You will also feel achy and your breasts will be firm. They will also start to leak.  If you are breastfeeding, continue as you have been and you can pump/express milk for comfort.   For concerns about your baby, please call your pediatrician For breastfeeding concerns, the lactation consultant can be reached at [phone]  Postpartum blues (feelings of happy one minute and sad another minute) are normal for the first few weeks but if it gets worse let your doctor know.

## 2022-05-01 NOTE — Anesthesia Postprocedure Evaluation (Signed)
Anesthesia Post Note  Patient: Catherine Stewart  Procedure(s) Performed: AN AD HOC LABOR EPIDURAL  Patient location during evaluation: Mother Baby Anesthesia Type: Epidural Level of consciousness: awake and alert Pain management: pain level controlled Vital Signs Assessment: post-procedure vital signs reviewed and stable Respiratory status: spontaneous breathing, nonlabored ventilation and respiratory function stable Cardiovascular status: stable Postop Assessment: no headache, no backache and epidural receding Anesthetic complications: no   No notable events documented.   Last Vitals:  Vitals:   05/01/22 0401 05/01/22 0757  BP: 110/70 125/74  Pulse: 95 85  Resp: 19 18  Temp: 37.2 C 36.5 C  SpO2: 100% 100%    Last Pain:  Vitals:   05/01/22 0758  TempSrc:   PainSc: [place]

## 2022-05-01 NOTE — Lactation Note (Signed)
This note was copied from a baby's chart. Lactation Consultation Note  Patient Name: Catherine Stewart [id number] Date: 05/01/2022 Age:21 hours Reason for consult: Follow-up assessment;Primapara;Term  Lactation in room for follow-up before anticipated discharge. Parents desiring 24 hour screens.  Maternal Data Has patient been taught Hand Expression?: Yes Does the patient have breastfeeding experience prior to this delivery?: No  Mom reports feeling more independent with breast feedings, and confident with getting baby to feed.  Feeding Mother's Current Feeding Choice: Breast Milk  Mom reports having fed baby a few times today, but did not call out for support/observation.  Mom also has not utilized the DEBP that was set-up previously and that [facility] encouraged her to use today.   Baby has had 3 wet diapers, however has not stooled since delivery (meconium at delivery).  LATCH Score     Lactation Tools Discussed/Used Tools: Pump;Nipple Shields (pump was set-up but never used) Nipple shield size: 20 Breast pump type: Double-Electric Breast Pump Pumping frequency: 0  Mom has a hands-free pump at home. LC demonstrated how to use the hand pump attachment. Reiterated the importance of pumping post BF for breast/nipple stimulation and adequate removal of milk/colostrum.  Interventions Interventions: Breast feeding basics reviewed;Hand express;Pre-pump if needed;DEBP;Ice;Education;Hand pump  Reviewed feeding patterns/behaviors, early hunger cues and feeding on demand. Educated on cluster feeding and growth spurts. Educated on 8 or more feedings per 24 hours, and tracking output to help know about adequate intake/transfer.  Discharge Discharge Education: Engorgement and breast care;Warning signs for feeding baby;Outpatient recommendation Pump: Hands Free;Manual  Reviewed anticipated breast changes and management of breast fullness and engorgement and nipple care. Discussed the use of ice  for swelling, pre pumping if needed to soften the breast prior to latching, breast massage/compression while feeding to help with transfer.  Consult Status Consult Status: Complete  Outpatient lactation number given. Encouraged to call with questions and ongoing BF support.  Lavonia Drafts 05/01/2022, 2:42 PM

## 2022-05-30 ENCOUNTER — Telehealth: Payer: Self-pay

## 2022-05-30 NOTE — Telephone Encounter (Signed)
[facility]- Discharge Call [facility] with pt about the following below. 1-Do you have any questions or concerns about yourself as you heal?  No 2-Any concerns or questions about your baby? No 3-Where does your baby sleep in your home?Bassinet. Review ABC's of safe sleep. 4-How was your stay at the hospital?Great 5-Did our team work together to care for you?Yes You should be receiving a survey in the mail soon.   We would really appreciate it if you could fill that out for [place] and return it in the mail.  We value the feedback to make improvements and continue the great work we do.   If you have any questions please feel free to call me back at [phone]

## 2022-06-28 ENCOUNTER — Ambulatory Visit: Payer: Managed Care, Other (non HMO) | Admitting: Advanced Practice Midwife

## 2022-06-29 ENCOUNTER — Encounter: Payer: Self-pay | Admitting: Advanced Practice Midwife

## 2022-06-29 ENCOUNTER — Ambulatory Visit (INDEPENDENT_AMBULATORY_CARE_PROVIDER_SITE_OTHER): Payer: Managed Care, Other (non HMO) | Admitting: Advanced Practice Midwife

## 2022-06-29 DIAGNOSIS — Z124 Encounter for screening for malignant neoplasm of cervix: Secondary | ICD-10-CM

## 2022-06-29 DIAGNOSIS — Z30430 Encounter for insertion of intrauterine contraceptive device: Secondary | ICD-10-CM | POA: Diagnosis not present

## 2022-06-29 MED ORDER — LEVONORGESTREL 20 MCG/DAY IU IUD
1.0000 | INTRAUTERINE_SYSTEM | Freq: Once | INTRAUTERINE | Status: AC
  Administered 2022-06-29: 1 via INTRAUTERINE

## 2022-06-29 NOTE — Progress Notes (Signed)
[facility]  GYNECOLOGY OFFICE PROCEDURE NOTE  Catherine Stewart is a 22 y.o. G2P1011 here for Mirena IUD insertion. No GYN concerns.  First PAP smear is today.  The patient is currently using condoms for contraception and her LMP is No LMP recorded..  The indication for her IUD is contraception/cycle control.  IUD Insertion Procedure Note Patient identified, informed consent performed, consent signed.   Discussed risks of irregular bleeding, cramping, infection, malpositioning, expulsion or uterine perforation of the IUD (1:1000 placements)  which may require further procedure such as laparoscopy.  IUD while effective at preventing pregnancy do not prevent transmission of sexually transmitted diseases and use of barrier methods for this purpose was discussed. Time out was performed.    Speculum placed in the vagina.  Cervix visualized.  Cleaned with Betadine x 2.  Grasped anteriorly with a single tooth tenaculum.  Uterus sounded to 7.25 cm. IUD placed per manufacturer's recommendations.  Strings trimmed to 3 cm. Tenaculum was removed, good hemostasis noted.  Patient tolerated procedure well.   Patient was given post-procedure instructions.  She was advised to have backup contraception for one week.  Patient was also asked to check IUD strings periodically and follow up in 4-6 weeks for IUD check.  IUD insertion CPT 58300,  Skyla [id number] Mirena [id number] Liletta J7297 Paraguard J7300 Catherine Stewart [phone] Modifer 25, plus Modifer 79 is done during a global billing visit    Tresea Mall, CNM [facility] 06/29/2022 3:22 PM

## 2022-06-29 NOTE — Progress Notes (Signed)
Catherine Stewart  Postpartum Visit  Chief Complaint:  Chief Complaint  Patient presents with   Postpartum Care    History of Present Illness: Patient is a 22 y.o. [name] presents for postpartum visit.  Review the Delivery Report for details.   Date of delivery: 04/30/2022 Type of delivery: Vaginal delivery - Vacuum or forceps assisted  no Episiotomy No.  Laceration: hemostatic labial abrasions, no repair  Pregnancy or labor problems:  no Any problems since the delivery:  no  Newborn Details:  SINGLETON :  1. BabyGender female. Birth weight: 4040 g Maternal Details:  Breast or formula feeding: breastfeeding Intercourse:  yes with protection Contraception after delivery:  Mirena IUD today, see note Any bowel or bladder issues: some constipation initially Post partum depression/anxiety noted:  No Edinburgh Post-Partum Depression Score: 3 Date of last PAP: First PAP today (she will be 22 in 2 weeks)  Review of Systems: Review of Systems  Constitutional:  Negative for chills and fever.  HENT:  Negative for congestion, ear discharge, ear pain, hearing loss, sinus pain and sore throat.   Eyes:  Negative for blurred vision and double vision.  Respiratory:  Negative for cough, shortness of breath and wheezing.   Cardiovascular:  Negative for chest pain, palpitations and leg swelling.  Gastrointestinal:  Negative for abdominal pain, blood in stool, constipation, diarrhea, heartburn, melena, nausea and vomiting.  Genitourinary:  Negative for dysuria, flank pain, frequency, hematuria and urgency.  Musculoskeletal:  Negative for back pain, joint pain and myalgias.  Skin:  Negative for itching and rash.  Neurological:  Negative for dizziness, tingling, tremors, sensory change, speech change, focal weakness, seizures, loss of consciousness, weakness and headaches.  Endo/Heme/Allergies:  Negative for environmental allergies. Does not bruise/bleed easily.  Psychiatric/Behavioral:  Negative  for depression, hallucinations, memory loss, substance abuse and suicidal ideas. The patient is not nervous/anxious and does not have insomnia.     Past Medical History:  Past Medical History:  Diagnosis Date   Allergies    Allergy    Anxiety    Depression     Past Surgical History:  Past Surgical History:  Procedure Laterality Date   INDUCED ABORTION  2019   TONSILECTOMY, ADENOIDECTOMY, BILATERAL MYRINGOTOMY AND TUBES     TONSILLECTOMY     TYMPANOSTOMY TUBE PLACEMENT     WISDOM TOOTH EXTRACTION     all four; age 81    Family History:  Family History  Problem Relation Age of Onset   Healthy Mother    Bipolar disorder Mother    Cirrhosis Father    COPD Paternal Grandmother    Healthy Paternal Grandfather    Cancer Half-Brother 26       osteosarcoma    Social History:  Social History   Socioeconomic History   Marital status: Married    Spouse name: Greig Castilla   Number of children: 0   Years of education: 12   Highest education level: Not on file  Occupational History   Occupation: [employer]    Employer: [employer]    Comment: microbiology lab  Tobacco Use   Smoking status: Never   Smokeless tobacco: Never  Vaping Use   Vaping Use: Some days   Devices: trying to quit  Substance and Sexual Activity   Alcohol use: Not Currently   Drug use: Never   Sexual activity: Yes    Partners: Male    Birth control/protection: Condom  Other Topics Concern   Not on file  Social History Narrative  Not on file   Social Determinants of Health   Financial Resource Strain: Low Risk  (09/04/2021)   Overall Financial Resource Strain (CARDIA)    Difficulty of Paying Living Expenses: Not hard at all  Food Insecurity: No Food Insecurity (04/29/2022)   Hunger Vital Sign    Worried About Running Out of Food in the Last Year: Never true    Ran Out of Food in the Last Year: Never true  Transportation Needs: No Transportation Needs (04/29/2022)   PRAPARE - Scientist, research (physical sciences) (Medical): No    Lack of Transportation (Non-Medical): No  Physical Activity: Unknown (09/04/2021)   Exercise Vital Sign    Days of Exercise per Week: 5 days    Minutes of Exercise per Session: Not on file  Stress: No Stress Concern Present (09/04/2021)   [employer] of Occupational Health - Occupational Stress Questionnaire    Feeling of Stress : Not at all  Social Connections: Moderately Isolated (09/04/2021)   Social Connection and Isolation Panel [NHANES]    Frequency of Communication with Friends and Family: More than three times a week    Frequency of Social Gatherings with Friends and Family: Once a week    Attends Religious Services: Never    Database administrator or Organizations: No    Attends Banker Meetings: Never    Marital Status: Married  Catering manager Violence: Not At Risk (04/29/2022)   Humiliation, Afraid, Rape, and Kick questionnaire    Fear of Current or Ex-Partner: No    Emotionally Abused: No    Physically Abused: No    Sexually Abused: No    Allergies:  Allergies  Allergen Reactions   Doxycycline Nausea And Vomiting   Penicillins Hives   Amoxicillin-Pot Clavulanate Rash    Medications: Prior to Admission medications   Medication Sig Start Date End Date Taking? Authorizing Provider  levonorgestrel (MIRENA) 20 MCG/DAY IUD 1 each by Intrauterine route once.   Yes [provider]  Prenatal Vit-Fe Fumarate-FA (PRENATAL VITAMINS) 28-0.8 MG TABS Take 1 tablet by mouth daily.   Yes [provider]    Physical Exam Blood pressure 106/67, pulse 96, height 5\' 3"  (1.6 m), weight 194 lb (88 kg), currently breastfeeding.    General: NAD HEENT: normocephalic, anicteric Pulmonary: No increased work of breathing Abdomen: NABS, soft, non-tender, non-distended.  Umbilicus without lesions.  No hepatomegaly, splenomegaly or masses palpable. No evidence of hernia. Genitourinary:  External: Normal external female  genitalia.  Normal urethral meatus, normal  Bartholin's and Skene's glands.    Vagina: Normal vaginal mucosa, no evidence of prolapse.    Cervix: Grossly normal in appearance, no bleeding  Uterus: Non-enlarged, mobile, normal contour.  No CMT  Adnexa: ovaries non-enlarged, no adnexal masses  Rectal: deferred Extremities: no edema, erythema, or tenderness Neurologic: Grossly intact Psychiatric: mood appropriate, affect full   Edinburgh Postnatal Depression Scale - 06/29/22 1420       Edinburgh Postnatal Depression Scale:  In the Past 7 Days   I have been able to laugh and see the funny side of things. 0    I have looked forward with enjoyment to things. 0    I have blamed myself unnecessarily when things went wrong. 0    I have been anxious or worried for no good reason. 0    I have felt scared or panicky for no good reason. 3    Things have been getting on top of me. 0  I have been so unhappy that I have had difficulty sleeping. 0    I have felt sad or miserable. 0    I have been so unhappy that I have been crying. 0    The thought of harming myself has occurred to me. 0    Edinburgh Postnatal Depression Scale Total 3             Assessment: 22 y.o. [id number] presenting for 6 week postpartum visit  Plan: Problem List Items Addressed This Visit   None Visit Diagnoses     6 weeks postpartum follow-up    -  Primary   Screening for cervical cancer       Relevant Orders   IGP, rfx Aptima HPV ASCU   Encounter for insertion of mirena IUD       Relevant Medications   levonorgestrel (MIRENA) 20 MCG/DAY IUD 1 each (Completed)        1) Contraception - Education given regarding options for contraception, as well as compatibility with breast feeding if applicable.  Patient plans on IUD for contraception.  2)  Pap - ASCCP guidelines and rationale discussed.  Patient opts for every 3 years screening interval. First PAP today  3) Patient underwent screening for postpartum  depression with no signs of depression  4) Return in about 4 weeks (around 07/27/2022) for iud string check.   Tresea Mall, CNM [facility] 06/29/2022 3:20 PM

## 2022-07-04 LAB — IGP, RFX APTIMA HPV ASCU

## 2022-07-25 ENCOUNTER — Ambulatory Visit: Payer: Managed Care, Other (non HMO) | Admitting: Obstetrics
